# Patient Record
Sex: Male | Born: 1960 | Race: White | Hispanic: No | Marital: Married | Smoking: Never smoker
Health system: Southern US, Community
[De-identification: ages and names within clinical notes are randomized; demographics above are authoritative.]

## PROBLEM LIST (undated history)

## (undated) DIAGNOSIS — K219 Gastro-esophageal reflux disease without esophagitis: Secondary | ICD-10-CM

## (undated) DIAGNOSIS — M1A9XX0 Chronic gout, unspecified, without tophus (tophi): Secondary | ICD-10-CM

## (undated) DIAGNOSIS — Z8051 Family history of malignant neoplasm of kidney: Secondary | ICD-10-CM

## (undated) DIAGNOSIS — E785 Hyperlipidemia, unspecified: Secondary | ICD-10-CM

## (undated) DIAGNOSIS — M5431 Sciatica, right side: Secondary | ICD-10-CM

## (undated) DIAGNOSIS — M109 Gout, unspecified: Secondary | ICD-10-CM

## (undated) DIAGNOSIS — F411 Generalized anxiety disorder: Secondary | ICD-10-CM

## (undated) DIAGNOSIS — Z973 Presence of spectacles and contact lenses: Secondary | ICD-10-CM

## (undated) DIAGNOSIS — Z800 Family history of malignant neoplasm of digestive organs: Secondary | ICD-10-CM

## (undated) DIAGNOSIS — M5137 Other intervertebral disc degeneration, lumbosacral region: Secondary | ICD-10-CM

## (undated) DIAGNOSIS — F419 Anxiety disorder, unspecified: Secondary | ICD-10-CM

## (undated) DIAGNOSIS — R351 Nocturia: Secondary | ICD-10-CM

## (undated) DIAGNOSIS — N529 Male erectile dysfunction, unspecified: Secondary | ICD-10-CM

## (undated) DIAGNOSIS — I10 Essential (primary) hypertension: Secondary | ICD-10-CM

## (undated) DIAGNOSIS — Z808 Family history of malignant neoplasm of other organs or systems: Secondary | ICD-10-CM

## (undated) DIAGNOSIS — Z85828 Personal history of other malignant neoplasm of skin: Secondary | ICD-10-CM

## (undated) DIAGNOSIS — M51379 Other intervertebral disc degeneration, lumbosacral region without mention of lumbar back pain or lower extremity pain: Secondary | ICD-10-CM

## (undated) HISTORY — PX: HERNIA REPAIR: SHX51

## (undated) HISTORY — DX: Family history of malignant neoplasm of other organs or systems: Z80.8

## (undated) HISTORY — PX: COLONOSCOPY: SHX174

## (undated) HISTORY — DX: Family history of malignant neoplasm of kidney: Z80.51

## (undated) HISTORY — PX: PROSTATE BIOPSY: SHX241

## (undated) HISTORY — PX: EYE SURGERY: SHX253

## (undated) HISTORY — PX: VASECTOMY: SHX75

## (undated) HISTORY — PX: NOSE SURGERY: SHX723

## (undated) HISTORY — DX: Family history of malignant neoplasm of digestive organs: Z80.0

---

## 1999-10-07 HISTORY — PX: INGUINAL HERNIA REPAIR: SUR1180

## 2004-10-11 ENCOUNTER — Ambulatory Visit: Payer: Self-pay | Admitting: Internal Medicine

## 2006-03-06 ENCOUNTER — Ambulatory Visit: Payer: Self-pay | Admitting: Unknown Physician Specialty

## 2006-03-18 ENCOUNTER — Ambulatory Visit: Payer: Self-pay | Admitting: Internal Medicine

## 2007-10-15 ENCOUNTER — Ambulatory Visit: Payer: Self-pay | Admitting: Internal Medicine

## 2007-10-21 ENCOUNTER — Ambulatory Visit: Payer: Self-pay | Admitting: Internal Medicine

## 2007-12-31 ENCOUNTER — Ambulatory Visit: Payer: Self-pay | Admitting: Specialist

## 2008-01-05 ENCOUNTER — Ambulatory Visit: Payer: Self-pay | Admitting: Specialist

## 2009-03-24 IMAGING — RF DG CHEST FLUORO
1 series · 15 of 16 positions shown · non-contrast
Comparison: none

REASON FOR EXAM: Right diaphragm paralysis
COMMENTS:

[Series 1: run · 4 acquisitions, 15 frames shown]
[im 1/4]
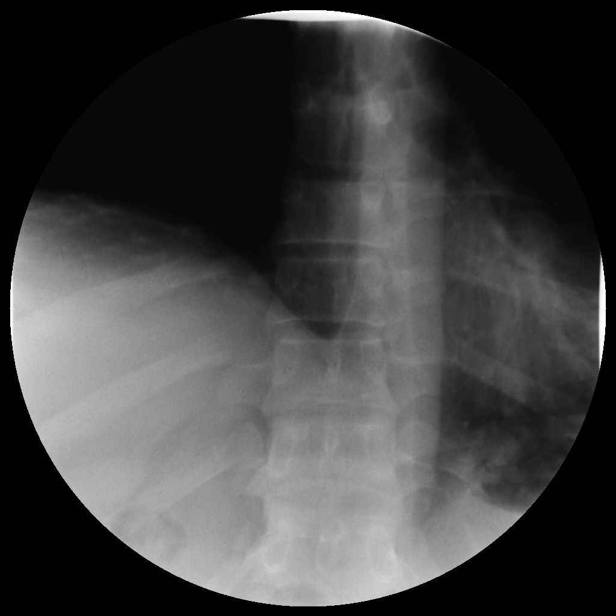
[im 1/4]
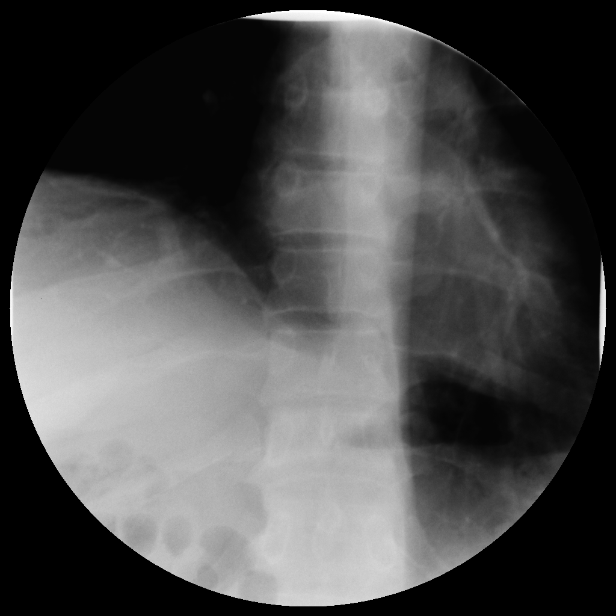
[im 1/4]
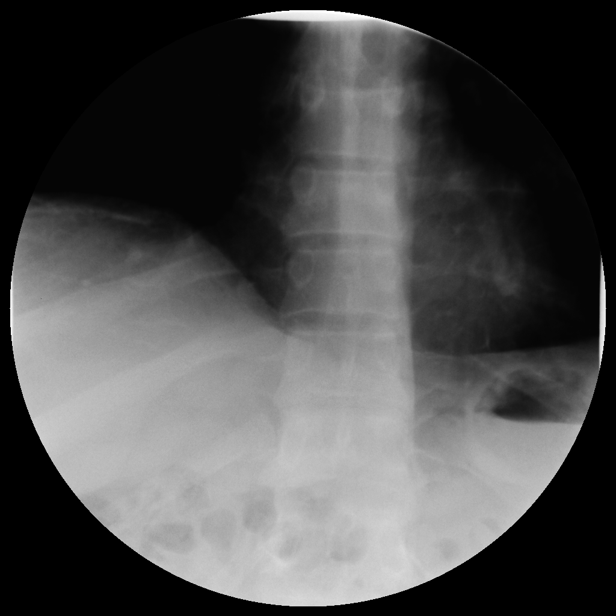
[im 1/4]
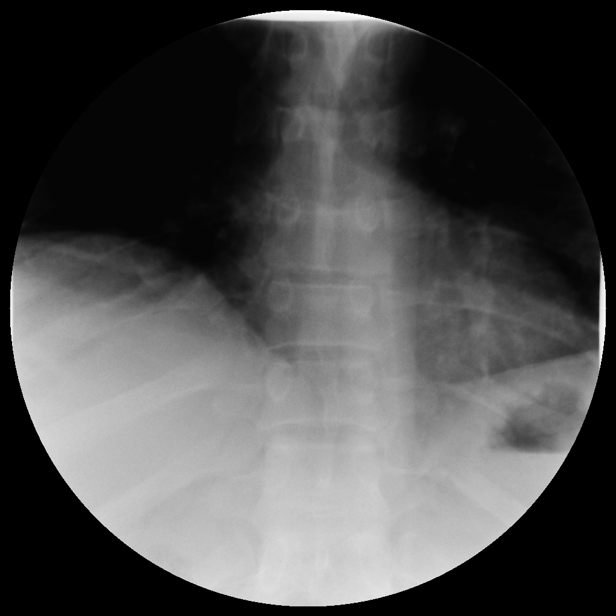
[im 2/4]
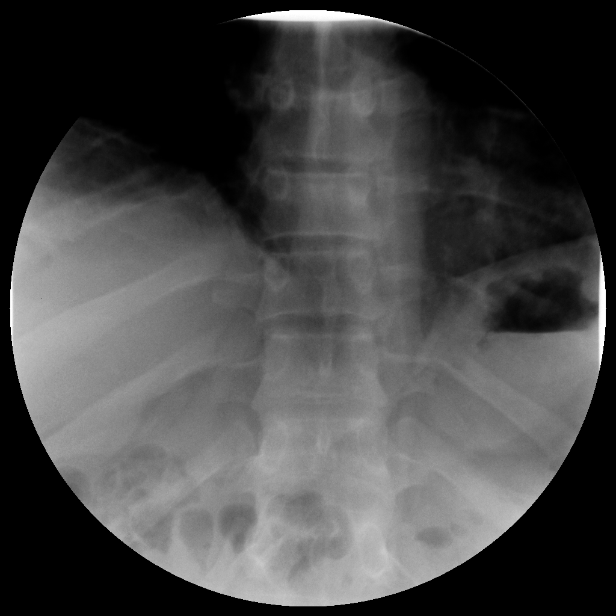
[im 2/4]
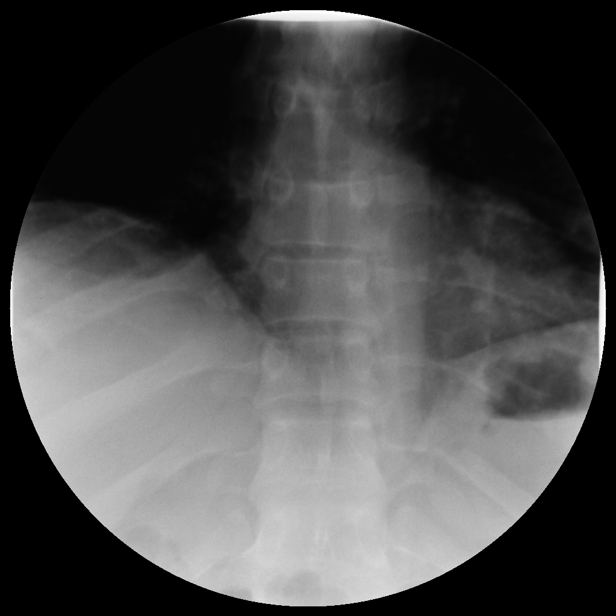
[im 2/4]
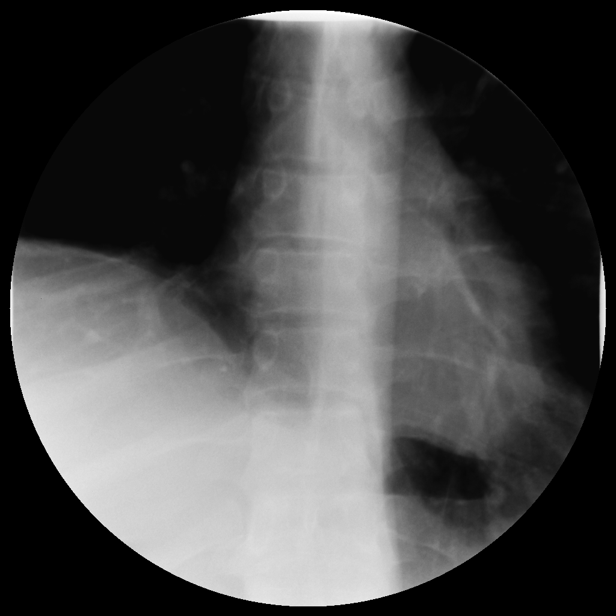
[im 3/4]
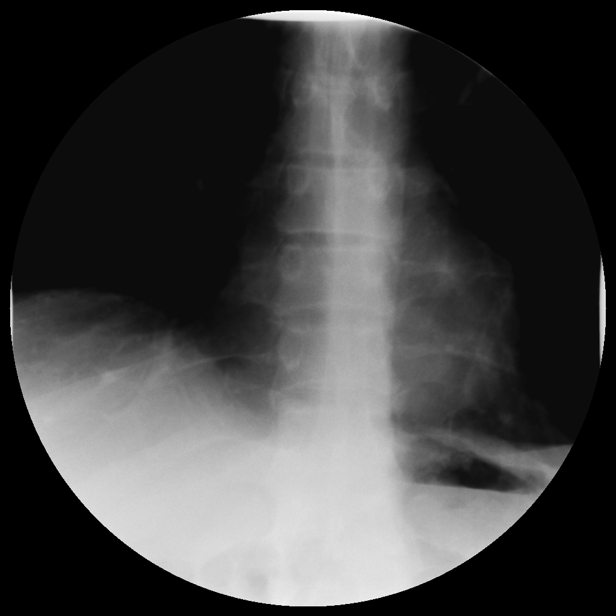
[im 3/4]
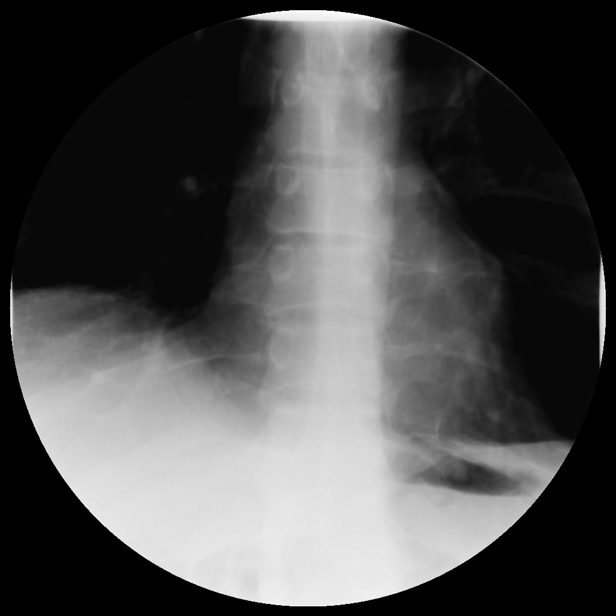
[im 3/4]
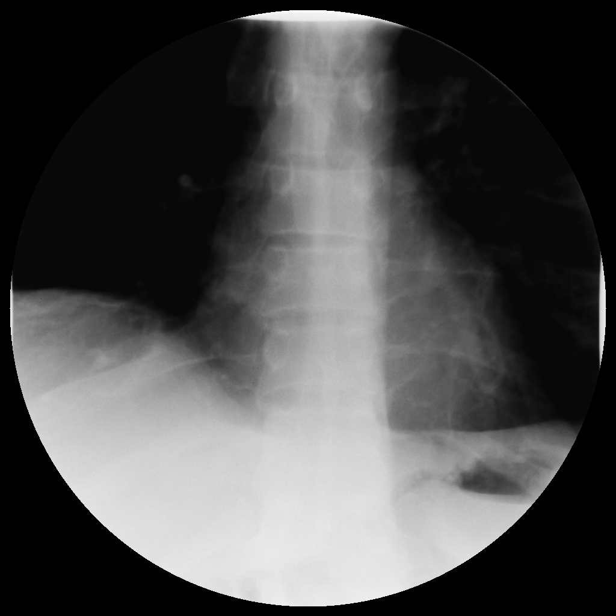
[im 3/4]
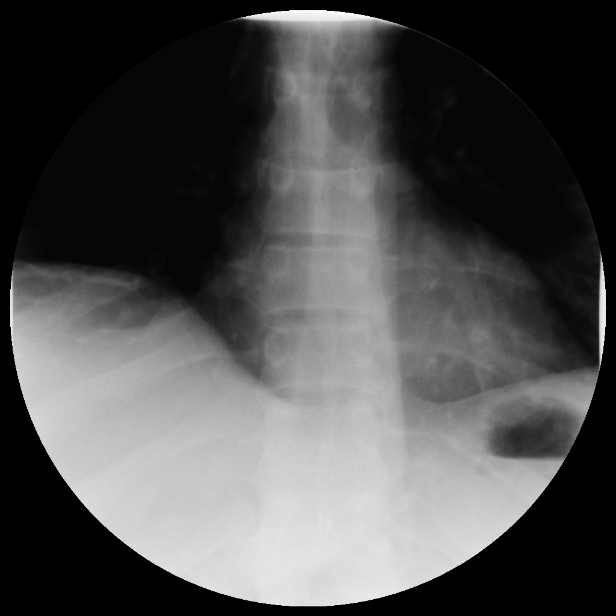
[im 4/4]
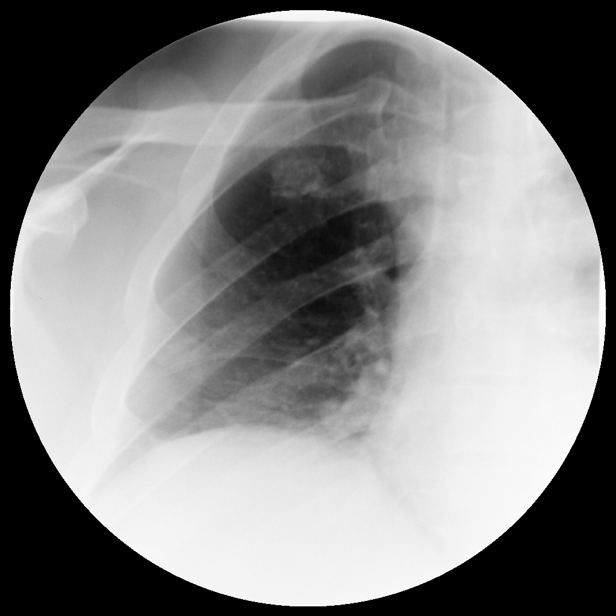
[im 4/4]
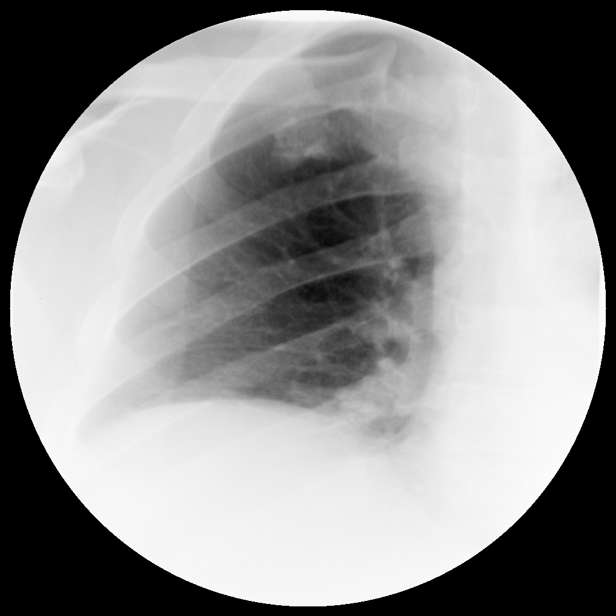
[im 4/4]
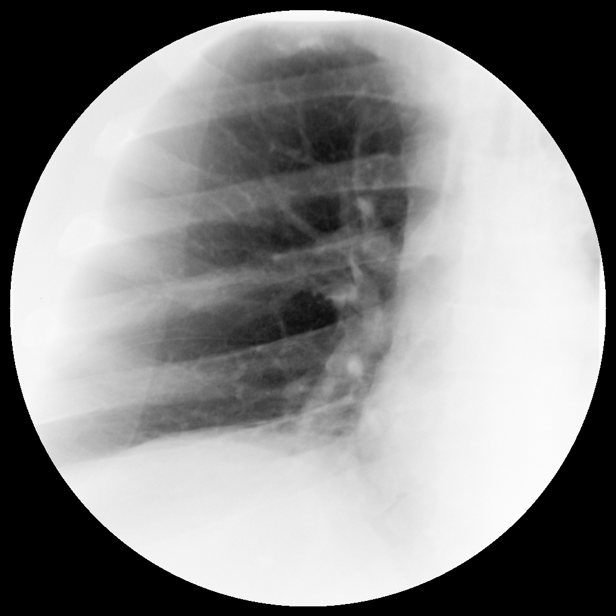
[im 4/4]
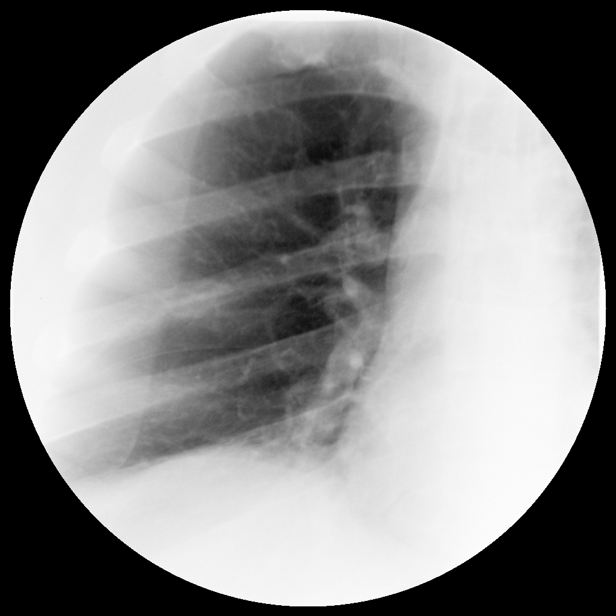

[15 of 16 positions shown; findings below may reference images not displayed]

PROCEDURE:     FL  - FL CHEST FLUORO  NO SPOT FILMS  - October 15, 2007 [DATE]

RESULT:     Fluoroscopic observation is performed during shallow and deep
breathing. There does appear to be movement of the LEFT and RIGHT sides of
the diaphragm. The LEFT shows a greater excursion inferiorly during deep
inspiration. The RIGHT chest appears to expand more laterally than
craniocaudad with deep inspiration. The patient is scheduled for a follow-up
CT reportedly.
IMPRESSION: Limited translation of the RIGHT side of the diaphragm when
compared to the LEFT. There is some motion present. Further investigation
with chest CT may be beneficial.

## 2012-10-06 HISTORY — PX: RETINAL DETACHMENT SURGERY: SHX105

## 2014-10-06 HISTORY — PX: BRONCHOSCOPY: SUR163

## 2014-11-03 DIAGNOSIS — M109 Gout, unspecified: Secondary | ICD-10-CM | POA: Insufficient documentation

## 2014-12-25 DIAGNOSIS — Z800 Family history of malignant neoplasm of digestive organs: Secondary | ICD-10-CM | POA: Insufficient documentation

## 2014-12-25 DIAGNOSIS — Z8042 Family history of malignant neoplasm of prostate: Secondary | ICD-10-CM | POA: Insufficient documentation

## 2016-02-26 DIAGNOSIS — I10 Essential (primary) hypertension: Secondary | ICD-10-CM | POA: Insufficient documentation

## 2017-08-13 ENCOUNTER — Ambulatory Visit: Payer: Self-pay | Admitting: Dietician

## 2017-09-02 ENCOUNTER — Encounter: Payer: BC Managed Care – PPO | Attending: Internal Medicine | Admitting: Dietician

## 2017-09-02 ENCOUNTER — Encounter: Payer: Self-pay | Admitting: Dietician

## 2017-09-02 VITALS — Ht 70.0 in | Wt 208.6 lb

## 2017-09-02 DIAGNOSIS — E663 Overweight: Secondary | ICD-10-CM

## 2017-09-02 DIAGNOSIS — R739 Hyperglycemia, unspecified: Secondary | ICD-10-CM | POA: Diagnosis not present

## 2017-09-02 DIAGNOSIS — E781 Pure hyperglyceridemia: Secondary | ICD-10-CM | POA: Diagnosis not present

## 2017-09-02 NOTE — Patient Instructions (Signed)
   Plan ahead for healthy nutritional balance in meals.   Include a "free" vegetable and/or fruit with each meal.   Choose mostly lowfat meats and dairy, limit fried foods. Choose healthy fats such as nuts, seeds, vegetable oils for cooking and seasoning foods.   Chose some whole grain foods and high fiber starches such as beans, peas, potatoes with skins, brown rice.

## 2017-09-02 NOTE — Progress Notes (Signed)
Medical Nutrition Therapy: Visit start time: 1630  end time: 1730  Assessment:  Diagnosis: hypertension, hyperglycemia Past medical history: gout Psychosocial issues/ stress concerns: none Preferred learning method:  . Auditory . Visual . Hands-on  Current weight: 208.4lbs with shoes  Height: 5'10" Medications, supplements: reconciled list in medical record  Progress and evaluation: Patient reports weight loss of 5-6lbs in recent weeks, in effort to reduce blood pressure and blood lipids. Patient has worked primarly on portion control. He reports frequent dining out due to late work hours for his wife.   Physical activity: walking 1-2 times a week, higher ADLs, yard work recently. Considering adding yoga  Dietary Intake:  Usual eating pattern includes 3 meals and 1-2 snacks per day. Dining out frequency: 4-5 meals per week.  Breakfast: coffee, egg sandwich, or premier or other protein shake; or yogurt; banana Snack: granola or protein bar, coffee black Lunch: often leftovers at home-- recently macaroni, stuffing, potato; 1-2 slices pizza; sub sandwich 1xmonth with chips and cookie; brunswick stew; if fast food--arbys light beef, potato cake; chicken sandwich or burger  Snack: none Supper: Italian--lasagna 1/2 portion with salad; pizza; mexican--tacos or burrito with beans and rice; chicken wings; at home grilled chicken/ meat; taco salad; crock pot meal roast with potatoes Snack: occasional popcorn, rarely ice cream; popsicle Beverages: water, 1-2 beers with dinner; rarely sweet tea or soda  Nutrition Care Education: Topics covered: weight control, triglyceride-lowering Basic nutrition: basic food groups, appropriate nutrient balance, appropriate meal and snack schedule, general nutrition guidelines    Weight control: benefits of weight control, identifying healthy weight, determining reasonable weight goal; provided guidance for 1800kcal daily intake.  Hypertriglyceridemia:  target  goals for lipids, healthy and unhealthy fats, role of fiber, food sources of potassium, magnesium (for BP control), plant sterols  Nutritional Diagnosis:  [place]-2.2 Altered nutrition-related laboratory As related to hypertriglyceridemia, history of hyperglycemia, hypertension.  As evidenced by lab report, MD notes. [place]-3.3 Overweight/obesity As related to excess calories, inactivity.  As evidenced by BMI 29, patient report.  Intervention: Instruction as noted above.   Set goals, listed notes with input from patient.   Patient has been making healthy diet changes, has noticed some benefit, and is motivated to continue.   No follow-up scheduled at this time, patient will schedule later if needed.  Education Materials given:  . Bad fats, Healthy fats chart (AHA) . Food Guide for [employer] . Food lists/ Planning A Balanced Meal . Goals/ instructions   Learner/ who was taught:  . Patient    Level of understanding: Marland Kitchen Verbalizes/ demonstrates competency  Demonstrated degree of understanding via:   Teach back Learning barriers: . None  Willingness to learn/ readiness for change: . Eager, change in progress   Monitoring and Evaluation:  Dietary intake, exercise, BG, BP, lipid control, and body weight      follow up: prn

## 2017-10-06 HISTORY — PX: COLONOSCOPY: SHX174

## 2018-01-20 DIAGNOSIS — E782 Mixed hyperlipidemia: Secondary | ICD-10-CM | POA: Insufficient documentation

## 2020-06-07 ENCOUNTER — Ambulatory Visit
Admission: RE | Admit: 2020-06-07 | Discharge: 2020-06-07 | Disposition: A | Discharge status: Home / Self Care | Payer: BC Managed Care – PPO | Source: Ambulatory Visit | Attending: Internal Medicine | Admitting: Internal Medicine

## 2020-06-07 ENCOUNTER — Other Ambulatory Visit: Payer: Self-pay | Admitting: Internal Medicine

## 2020-06-07 ENCOUNTER — Other Ambulatory Visit: Payer: Self-pay

## 2020-06-07 DIAGNOSIS — R519 Headache, unspecified: Secondary | ICD-10-CM | POA: Insufficient documentation

## 2020-07-05 DIAGNOSIS — Z8616 Personal history of COVID-19: Secondary | ICD-10-CM | POA: Insufficient documentation

## 2021-10-06 DIAGNOSIS — Z85828 Personal history of other malignant neoplasm of skin: Secondary | ICD-10-CM

## 2021-10-06 HISTORY — DX: Personal history of other malignant neoplasm of skin: Z85.828

## 2021-10-06 HISTORY — PX: MOHS SURGERY: SUR867

## 2021-11-15 IMAGING — CT CT HEAD W/O CM
3 series · 16 of 47 positions shown, 19 images · non-contrast
Comparison: MRI 4444

CLINICAL DATA: Headache

EXAM:
CT HEAD WITHOUT CONTRAST
TECHNIQUE: Contiguous axial images were obtained from the base of the skull
through the vertex without intravenous contrast.

[Series 2: head wo · axial · 0.47mm/px · z∈[-141,-11]mm · 10 of 32 slices shown, 13 images]
[im 3/32  brain]
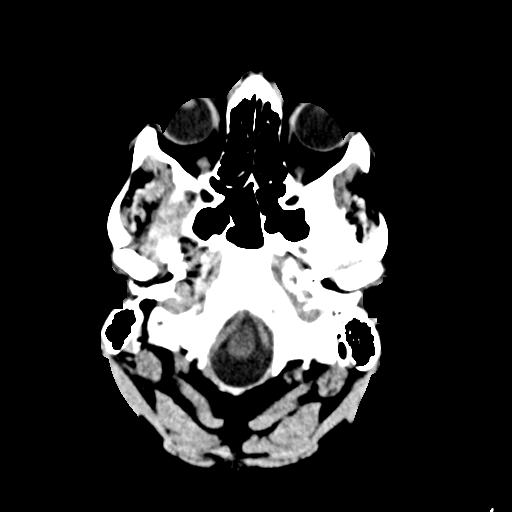
[im 3/32  bone]
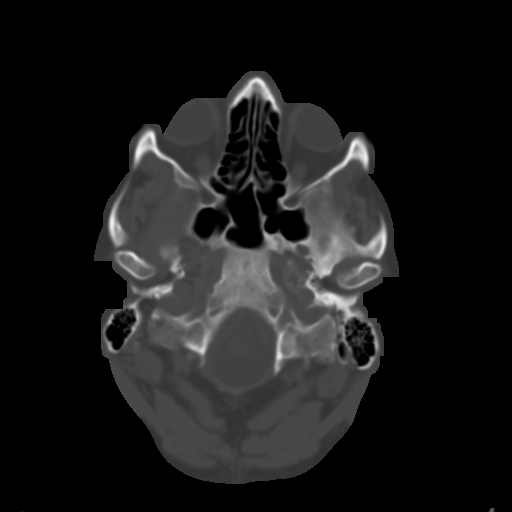
[im 6/32  brain]
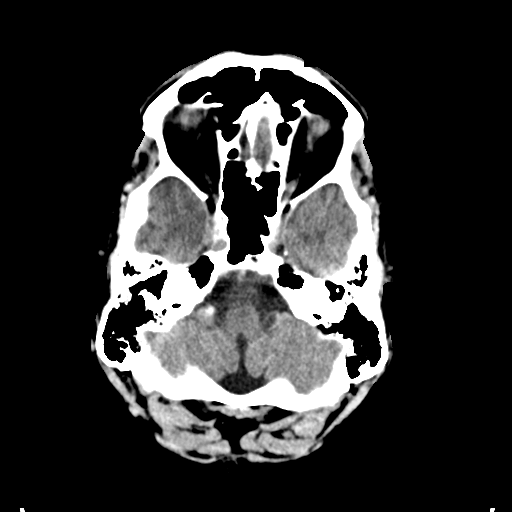
[im 9/32  brain]
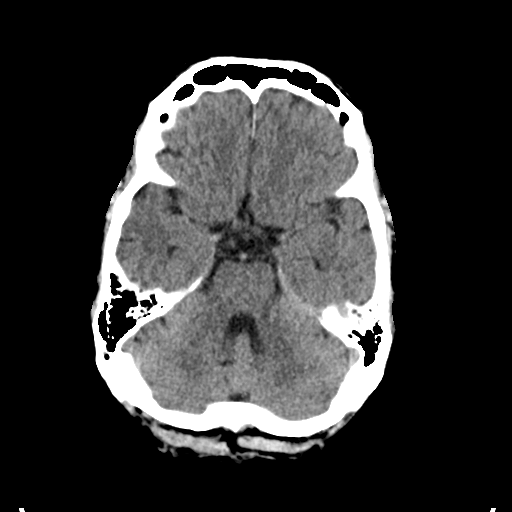
[im 11/32  brain]
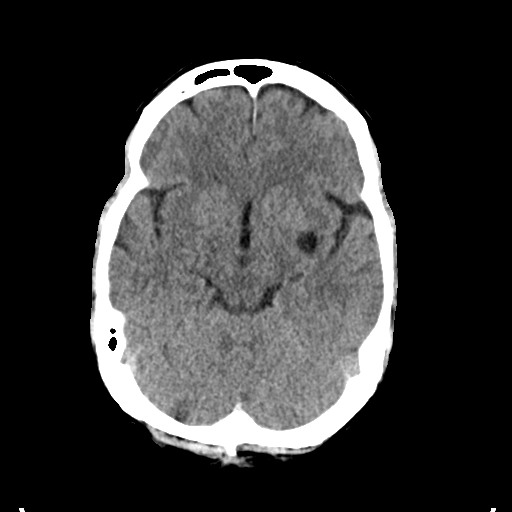
[im 14/32  brain]
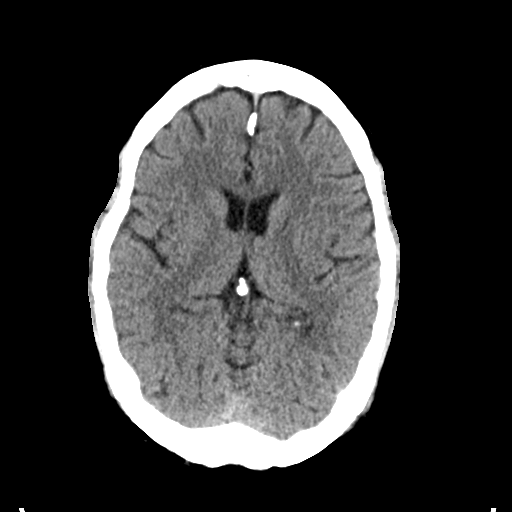
[im 14/32  bone]
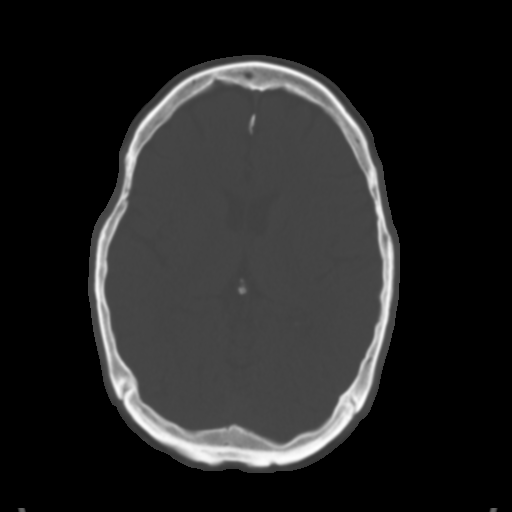
[im 18/32  brain]
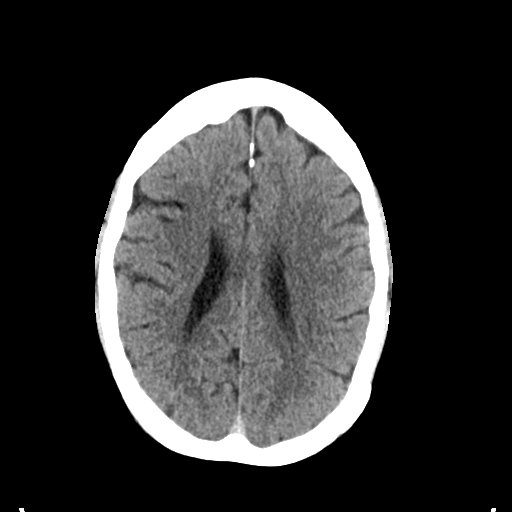
[im 21/32  brain]
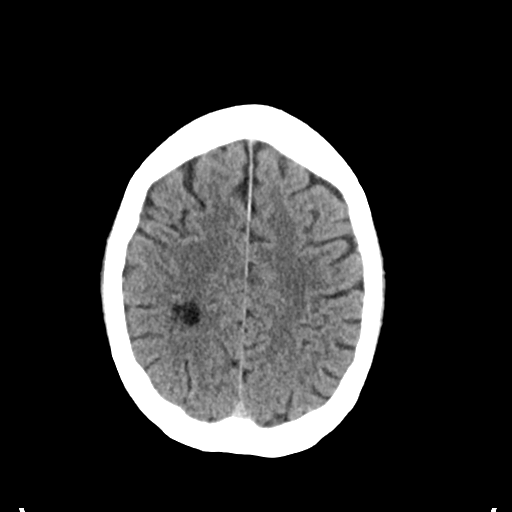
[im 24/32  brain]
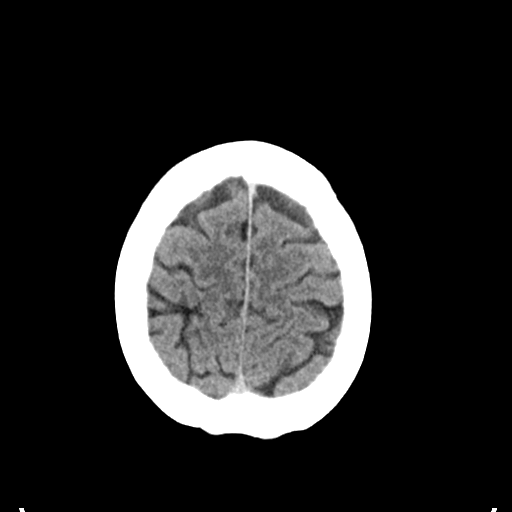
[im 26/32  brain]
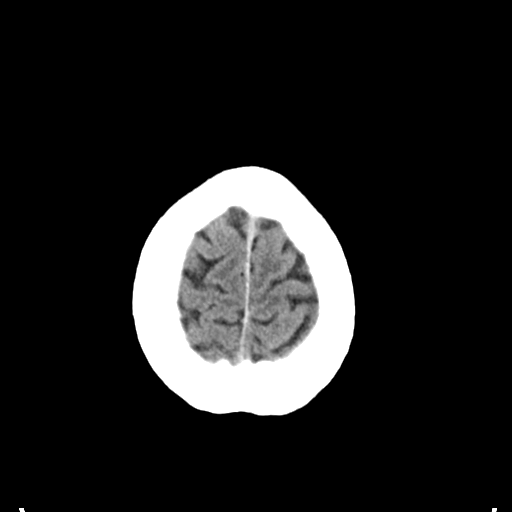
[im 26/32  bone]
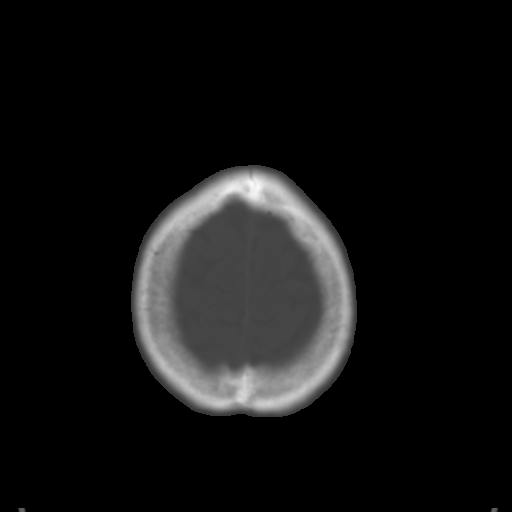
[im 29/32  brain]
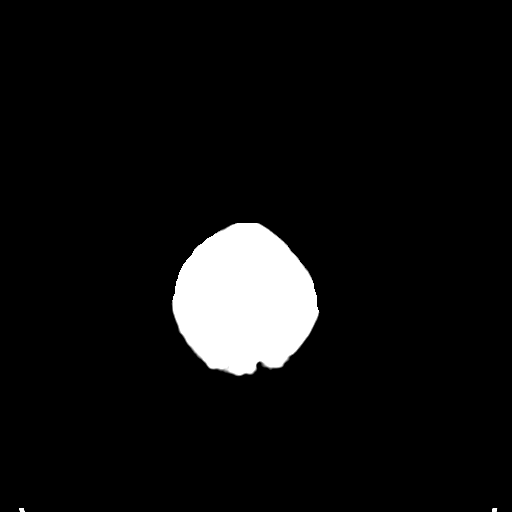

[Series 4: coronal soft tissue · coronal · 0.31mm/px · 3 of 67 slices shown]
[im 23/67  brain]
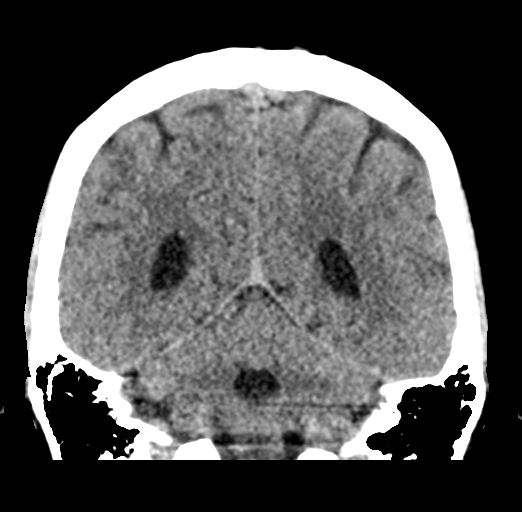
[im 30/67  brain]
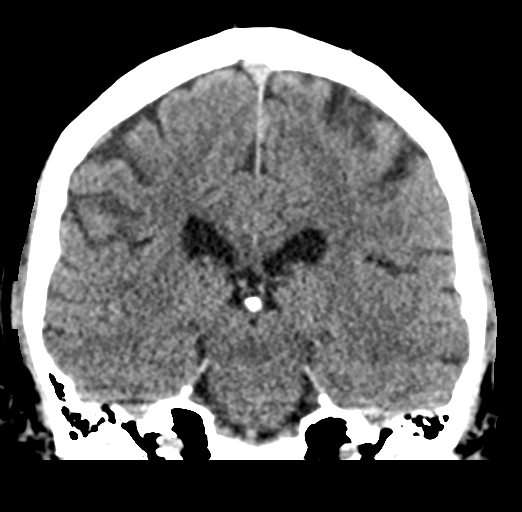
[im 37/67  brain]
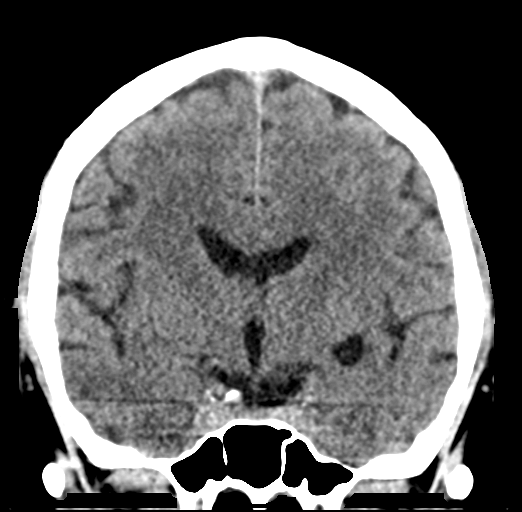

[Series 5: sagittal soft tissue · sagittal · 0.31mm/px · 3 of 54 slices shown]
[im 18/54  brain]
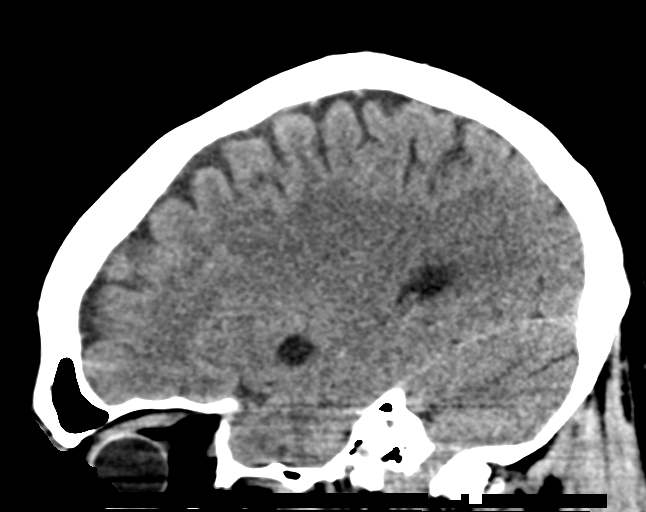
[im 27/54  brain]
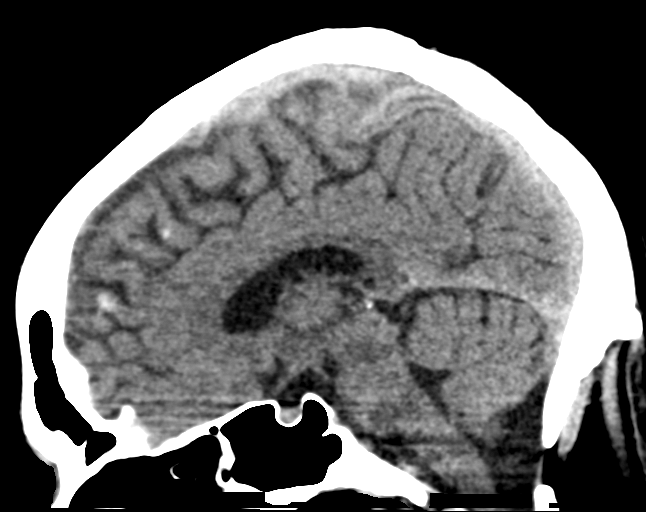
[im 36/54  brain]
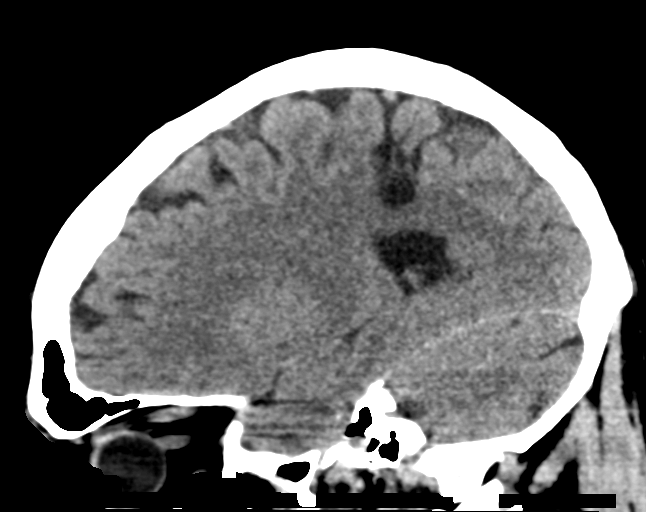

[16 of 47 positions shown; findings below may reference images not displayed]

FINDINGS: Brain: There is no acute intracranial hemorrhage, mass effect, or
edema.

No acute appearing loss of gray-white differentiation. There is a
small area encephalomalacia in the right parietal lobe. An even
smaller area is present in the right frontal lobe. Low-density
lesion of the inferior left basal ganglia probably reflects a
prominent perivascular space.

There is no extra-axial fluid collection. Ventricles and sulci are
within normal limits in size and configuration.

Vascular: No hyperdense vessel or unexpected calcification.

Skull: Calvarium is unremarkable.

Sinuses/Orbits: No acute finding.

Other: None.
IMPRESSION: No acute intracranial abnormality. Small areas of right frontal and
parietal encephalomalacia, which were present in 4444.

## 2022-07-23 ENCOUNTER — Other Ambulatory Visit: Payer: Self-pay | Admitting: Internal Medicine

## 2022-07-23 DIAGNOSIS — M5416 Radiculopathy, lumbar region: Secondary | ICD-10-CM

## 2022-08-18 ENCOUNTER — Ambulatory Visit
Admission: RE | Admit: 2022-08-18 | Discharge: 2022-08-18 | Disposition: A | Discharge status: Home / Self Care | Payer: BC Managed Care – PPO | Source: Ambulatory Visit | Attending: Internal Medicine | Admitting: Internal Medicine

## 2022-08-18 DIAGNOSIS — M5416 Radiculopathy, lumbar region: Secondary | ICD-10-CM

## 2022-09-05 DIAGNOSIS — Z87820 Personal history of traumatic brain injury: Secondary | ICD-10-CM

## 2022-09-05 HISTORY — DX: Personal history of traumatic brain injury: Z87.820

## 2022-09-16 ENCOUNTER — Other Ambulatory Visit: Payer: Self-pay | Admitting: Internal Medicine

## 2022-09-16 ENCOUNTER — Ambulatory Visit
Admission: RE | Admit: 2022-09-16 | Discharge: 2022-09-16 | Disposition: A | Discharge status: Home / Self Care | Payer: BC Managed Care – PPO | Source: Ambulatory Visit | Attending: Internal Medicine | Admitting: Internal Medicine

## 2022-09-16 DIAGNOSIS — R42 Dizziness and giddiness: Secondary | ICD-10-CM | POA: Insufficient documentation

## 2022-09-16 DIAGNOSIS — H539 Unspecified visual disturbance: Secondary | ICD-10-CM | POA: Diagnosis not present

## 2022-09-16 DIAGNOSIS — F0781 Postconcussional syndrome: Secondary | ICD-10-CM | POA: Diagnosis not present

## 2022-09-24 ENCOUNTER — Other Ambulatory Visit: Payer: Self-pay | Admitting: Internal Medicine

## 2022-09-24 DIAGNOSIS — R972 Elevated prostate specific antigen [PSA]: Secondary | ICD-10-CM

## 2022-09-24 DIAGNOSIS — Z8042 Family history of malignant neoplasm of prostate: Secondary | ICD-10-CM

## 2022-10-04 ENCOUNTER — Ambulatory Visit
Admission: RE | Admit: 2022-10-04 | Discharge: 2022-10-04 | Disposition: A | Discharge status: Home / Self Care | Payer: BC Managed Care – PPO | Source: Ambulatory Visit | Attending: Internal Medicine | Admitting: Internal Medicine

## 2022-10-04 DIAGNOSIS — Z8042 Family history of malignant neoplasm of prostate: Secondary | ICD-10-CM | POA: Insufficient documentation

## 2022-10-04 DIAGNOSIS — R972 Elevated prostate specific antigen [PSA]: Secondary | ICD-10-CM | POA: Insufficient documentation

## 2022-10-04 MED ORDER — GADOBUTROL 1 MMOL/ML IV SOLN
9.0000 mL | Freq: Once | INTRAVENOUS | Status: AC | PRN
  Administered 2022-10-04: 10 mL via INTRAVENOUS

## 2022-10-10 ENCOUNTER — Encounter: Payer: Self-pay | Admitting: Internal Medicine

## 2022-10-10 ENCOUNTER — Other Ambulatory Visit: Payer: Self-pay | Admitting: Internal Medicine

## 2022-10-10 DIAGNOSIS — C61 Malignant neoplasm of prostate: Secondary | ICD-10-CM

## 2022-10-21 ENCOUNTER — Ambulatory Visit: Payer: BC Managed Care – PPO

## 2022-11-06 DIAGNOSIS — C61 Malignant neoplasm of prostate: Secondary | ICD-10-CM

## 2022-11-06 DIAGNOSIS — R972 Elevated prostate specific antigen [PSA]: Secondary | ICD-10-CM

## 2022-11-06 HISTORY — DX: Malignant neoplasm of prostate: C61

## 2022-11-06 HISTORY — DX: Elevated prostate specific antigen (PSA): R97.20

## 2022-11-28 ENCOUNTER — Telehealth: Payer: Self-pay | Admitting: Radiation Oncology

## 2022-11-28 ENCOUNTER — Other Ambulatory Visit (HOSPITAL_COMMUNITY): Payer: Self-pay | Admitting: Urology

## 2022-11-28 DIAGNOSIS — C61 Malignant neoplasm of prostate: Secondary | ICD-10-CM

## 2022-11-28 DIAGNOSIS — C7951 Secondary malignant neoplasm of bone: Secondary | ICD-10-CM

## 2022-11-28 NOTE — Telephone Encounter (Signed)
Unable to leave message for patient to call back to schedule consult per 2/23 referral. Will try again Monday.

## 2022-12-04 ENCOUNTER — Encounter: Payer: Self-pay | Admitting: Radiation Oncology

## 2022-12-04 NOTE — Progress Notes (Signed)
GU Location of Tumor / Histology: Prostate Ca  If Prostate Cancer, Gleason Score is (4 + 3) and PSA is (2.45)  Biopsies      10/04/2022 Dr. Emily Filbert MR Prostate with/without Contrast CLINICAL DATA:  A 62 year old male presents with elevated PSA with increased PSA velocity and abnormal digital rectal examination.  FINDINGS: Prostate: Transitional zone: Signs of BPH. Mild changes of BPH. No high-risk lesion in the transitional zone.   Peripheral zone: Abnormal low T2 signal throughout the RIGHT posterolateral peripheral zone extending from the apex to the base with markedly restricted diffusion extending to the anterior gland near the prostate apex on the RIGHT. Lesion measuring greater than 15 mm in the axial plane approximately 18 mm single greatest axial dimension on ADC (image 15/8) lesion displays marked restricted diffusion on calculated high B value images. Indistinct capsular margins along the RIGHT posterolateral gland. PIRADS category 5.   Volume: 19.9 cc   Transcapsular spread: Suspected along RIGHT posterolateral gland from the base through the mid gland. Also long segment capsular abutment of this large lesion.   Seminal vesicle involvement: Absent   Neurovascular bundle involvement: Suspected on the RIGHT (image 35/10)   Pelvic adenopathy: Absent   Bone metastasis: Sclerotic lesion in the LEFT iliac bone is nonspecific showing no signs of enhancement.   Other findings: Extensive diverticular disease of the colon.   IMPRESSION: 1. PIRADS category 5 lesion in the RIGHT posterolateral peripheral zone extending from the apex to the base with markedly restricted diffusion extending to the anterior gland near the prostate apex on the RIGHT. 2. Indistinct capsular margins along the RIGHT posterolateral gland from the base through the mid gland suspicious for extracapsular extension. 3. Neurovascular bundle involvement on the RIGHT at the base. 4. Sclerotic lesion in  the LEFT iliac bone is nonspecific showing no signs of enhancement. This may represent a bone island. Urologic consultation is advised if not yet performed for further management of above findings. PSA level and correlation with tissue diagnosis as warranted may be helpful to guide further management. 5. Extensive diverticular disease of the colon.   Past/Anticipated interventions by urology, if any:   Past/Anticipated interventions by medical oncology, if any: NA  Weight changes, if any: {:18581}  IPSS: SHIM:  Bowel/Bladder complaints, if any: {:18581}   Nausea/Vomiting, if any: {:18581}  Pain issues, if any:  {:18581}  SAFETY ISSUES: Prior radiation? {:18581} Pacemaker/ICD? {:18581} Possible current pregnancy? Male Is the patient on methotrexate? No  Current Complaints / other details:

## 2022-12-15 ENCOUNTER — Ambulatory Visit (HOSPITAL_COMMUNITY)
Admission: RE | Admit: 2022-12-15 | Discharge: 2022-12-15 | Disposition: A | Discharge status: Home / Self Care | Payer: BC Managed Care – PPO | Source: Ambulatory Visit | Attending: Urology | Admitting: Urology

## 2022-12-15 DIAGNOSIS — C7951 Secondary malignant neoplasm of bone: Secondary | ICD-10-CM | POA: Insufficient documentation

## 2022-12-15 DIAGNOSIS — C61 Malignant neoplasm of prostate: Secondary | ICD-10-CM | POA: Insufficient documentation

## 2022-12-15 MED ORDER — PIFLIFOLASTAT F 18 (PYLARIFY) INJECTION
9.0000 | Freq: Once | INTRAVENOUS | Status: AC
  Administered 2022-12-15: 9.95 via INTRAVENOUS

## 2022-12-17 ENCOUNTER — Ambulatory Visit
Admission: RE | Admit: 2022-12-17 | Discharge: 2022-12-17 | Disposition: A | Discharge status: Home / Self Care | Payer: BC Managed Care – PPO | Source: Ambulatory Visit | Attending: Radiation Oncology | Admitting: Radiation Oncology

## 2022-12-17 ENCOUNTER — Encounter: Payer: Self-pay | Admitting: Radiation Oncology

## 2022-12-17 VITALS — BP 139/90 | HR 84 | Temp 96.9°F | Resp 18 | Ht 70.0 in | Wt 213.1 lb

## 2022-12-17 DIAGNOSIS — Z800 Family history of malignant neoplasm of digestive organs: Secondary | ICD-10-CM | POA: Diagnosis not present

## 2022-12-17 DIAGNOSIS — K219 Gastro-esophageal reflux disease without esophagitis: Secondary | ICD-10-CM | POA: Insufficient documentation

## 2022-12-17 DIAGNOSIS — F17210 Nicotine dependence, cigarettes, uncomplicated: Secondary | ICD-10-CM | POA: Insufficient documentation

## 2022-12-17 DIAGNOSIS — M109 Gout, unspecified: Secondary | ICD-10-CM | POA: Diagnosis not present

## 2022-12-17 DIAGNOSIS — C61 Malignant neoplasm of prostate: Secondary | ICD-10-CM | POA: Insufficient documentation

## 2022-12-17 DIAGNOSIS — Z85828 Personal history of other malignant neoplasm of skin: Secondary | ICD-10-CM | POA: Diagnosis not present

## 2022-12-17 DIAGNOSIS — Z791 Long term (current) use of non-steroidal anti-inflammatories (NSAID): Secondary | ICD-10-CM | POA: Diagnosis not present

## 2022-12-17 DIAGNOSIS — I10 Essential (primary) hypertension: Secondary | ICD-10-CM | POA: Diagnosis not present

## 2022-12-17 DIAGNOSIS — Z79899 Other long term (current) drug therapy: Secondary | ICD-10-CM | POA: Diagnosis not present

## 2022-12-17 DIAGNOSIS — E785 Hyperlipidemia, unspecified: Secondary | ICD-10-CM | POA: Diagnosis not present

## 2022-12-17 HISTORY — DX: Gastro-esophageal reflux disease without esophagitis: K21.9

## 2022-12-17 HISTORY — DX: Male erectile dysfunction, unspecified: N52.9

## 2022-12-17 HISTORY — DX: Personal history of other malignant neoplasm of skin: Z85.828

## 2022-12-17 HISTORY — DX: Essential (primary) hypertension: I10

## 2022-12-17 HISTORY — DX: Anxiety disorder, unspecified: F41.9

## 2022-12-17 HISTORY — DX: Gout, unspecified: M10.9

## 2022-12-17 HISTORY — DX: Hyperlipidemia, unspecified: E78.5

## 2022-12-17 NOTE — Progress Notes (Addendum)
Radiation Oncology         [phone] ________________________________  Initial Outpatient Consultation  Name: Marcus Kramer MRN: [id number]  Date: 12/17/2022  DOB: Jun 14, 1961  WU:[id number], Marcus Negus, MD  Marcus Purpura, MD   REFERRING PHYSICIAN: Heloise Purpura, MD  DIAGNOSIS: 62 y.o. 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    ICD-10-CM   1. Malignant neoplasm of prostate [facility])  C61 Ambulatory Referral to Genetics      HISTORY OF PRESENT ILLNESS: Marcus Kramer is a 62 y.o. male with a diagnosis of prostate cancer. Marcus Kramer was noted to have a rising PSA of 2.45, on routine labs with his primary care physician, Marcus Kramer.  He has a strong paternal family history of prostate cancer and has been undergoing PSA screening every 6 months with a baseline PSA between 1.6 -2.0.  The elevation in PSA prompted further evaluation with a prostate MRI which was performed on 10/07/2022 and showed an 18 mm PI-RADS 5 lesion on the right, from apex to base, with possible extracapsular extension and neurovascular bundle involvement.  There was no concerning lymphadenopathy but they did comment on an indeterminate sclerotic lesion in the left iliac bone.  Accordingly, he was referred for evaluation in urology by Dr. Laverle Kramer on 10/28/2022,  digital rectal examination was performed at that time revealing very slight induration on the right, not particularly suspicious.  The patient proceeded to MRI fusion transrectal ultrasound with 16 biopsies of the prostate on 11/06/2022.  The prostate volume measured 24 cc.  Out of 16 core biopsies, 10 were positive.  The maximum Gleason score was 4+3, and this was seen in all 4/4 samples from the MRI ROI and in the right mid lateral core.  Additionally, Gleason 3+4 was seen in the right mid, right apex and right apex lateral and Gleason 3+3 in the left mid and right base lateral.  He had a  PSMA PET scan for disease staging on 12/15/2022 and this was negative for any evidence of osseous or visceral metastases.  Marcus Kramer reviewed the biopsy and imaging results with his urologist and he has kindly been referred today for discussion of potential radiation treatment options.   PREVIOUS RADIATION THERAPY: No  PAST MEDICAL HISTORY:  Past Medical History:  Diagnosis Date   Anxiety    ED (erectile dysfunction)    ED (erectile dysfunction)    Elevated PSA 11/06/2022   10/28/2022   GERD (gastroesophageal reflux disease)    Gout    Gout    History of skin cancer    Hyperlipidemia    Hypertension       PAST SURGICAL HISTORY: Past Surgical History:  Procedure Laterality Date   COLONOSCOPY     EYE SURGERY Right    HERNIA REPAIR     NOSE SURGERY     PROSTATE BIOPSY     PROSTATE BIOPSY     VASECTOMY      FAMILY HISTORY:  Family History  Problem Relation Age of Onset   Brain cancer Mother    Colon cancer Mother    Stroke Father    Parkinson's disease Father    Brain cancer Maternal Grandfather    Heart attack Maternal Grandfather    Lung cancer Maternal Grandfather    Parkinson's disease Paternal Grandfather    Stroke Paternal Grandfather     SOCIAL HISTORY:  Social History   Socioeconomic History  Marital status: Married    Spouse name: Not on file   Number of children: Not on file   Years of education: Not on file   Highest education level: Not on file  Occupational History   Not on file  Tobacco Use   Smoking status: Every Day    Types: Cigars   Smokeless tobacco: Never   Tobacco comments:    occasional, none in past year  Vaping Use   Vaping Use: Never used  Substance and Sexual Activity   Alcohol use: Yes    Alcohol/week: 9.0 standard drinks of alcohol    Types: 9 Standard drinks or equivalent per week    Comment: 10 or less drinks per week   Drug use: Never   Sexual activity: Yes  Other Topics Concern   Not on file  Social History  Narrative   Not on file   Social Determinants of Health   Financial Resource Strain: Not on file  Food Insecurity: No Food Insecurity (12/17/2022)   Hunger Vital Sign    Worried About Running Out of Food in the Last Year: Never true    Ran Out of Food in the Last Year: Never true  Transportation Needs: No Transportation Needs (12/17/2022)   PRAPARE - Administrator, Civil Service (Medical): No    Lack of Transportation (Non-Medical): No  Physical Activity: Not on file  Stress: Not on file  Social Connections: Not on file  Intimate Partner Violence: Not At Risk (12/17/2022)   Humiliation, Afraid, Rape, and Kick questionnaire    Fear of Current or Ex-Partner: No    Emotionally Abused: No    Physically Abused: No    Sexually Abused: No    ALLERGIES: Sulfa antibiotics  MEDICATIONS:  Current Outpatient Medications  Medication Sig Dispense Refill   diclofenac (VOLTAREN) 75 MG EC tablet Take 75 mg by mouth 2 (two) times daily.     losartan (COZAAR) 100 MG tablet Take 100 mg by mouth daily.     venlafaxine XR (EFFEXOR-XR) 75 MG 24 hr capsule Take 75 mg by mouth daily.     allopurinol (ZYLOPRIM) 300 MG tablet TAKE ONE TABLET EVERY DAY     DIPHENHYDRAMINE-ACETAMINOPHEN PO Take by mouth.     Multiple Vitamin (MULTI-VITAMINS) TABS Take by mouth.     omeprazole (PRILOSEC) 40 MG capsule TAKE 1 CAPSULE BY MOUTH EVERY MORNING FOR ACID     sildenafil (REVATIO) 20 MG tablet TAKE ONE TABLET 3 TIMES DAILY AS NEEDED     No current facility-administered medications for this encounter.    REVIEW OF SYSTEMS:  On review of systems, Marcus Kramer reports that he is doing well Kramer. He denies any chest pain, shortness of breath, cough, fevers, chills, night sweats, unintended weight changes. He denies any bowel disturbances, and denies abdominal pain, nausea or vomiting. He denies any new musculoskeletal or joint aches or pains. His IPSS was 7, indicating mild urinary symptoms. His SHIM was  24, indicating he does not have erectile dysfunction. A complete review of systems is obtained and is otherwise negative.    PHYSICAL EXAM:  Wt Readings from Last 3 Encounters:  12/17/22 213 lb 2 oz (96.7 kg)  09/02/17 208 lb 9.6 oz (94.6 kg)   Temp Readings from Last 3 Encounters:  12/17/22 (!) 96.9 F (36.1 C) (Temporal)   BP Readings from Last 3 Encounters:  12/17/22 (!) 139/90   Pulse Readings from Last 3 Encounters:  12/17/22 84   Pain  Assessment Pain Score: 0-No pain/10  In general this is a well appearing Caucasian man in no acute distress. He's alert and oriented x4 and appropriate throughout the examination. Cardiopulmonary assessment is negative for acute distress, and he exhibits normal effort.     KPS = 90  100 - Normal; no complaints; no evidence of disease. 90   - Able to carry on normal activity; minor signs or symptoms of disease. 80   - Normal activity with effort; some signs or symptoms of disease. 73   - Cares for self; unable to carry on normal activity or to do active work. 60   - Requires occasional assistance, but is able to care for most of his personal needs. 50   - Requires considerable assistance and frequent medical care. 40   - Disabled; requires special care and assistance. 30   - Severely disabled; hospital admission is indicated although death not imminent. 20   - Very sick; hospital admission necessary; active supportive treatment necessary. 10   - Moribund; fatal processes progressing rapidly. 0     - Dead  Karnofsky DA, Abelmann WH, Craver LS and Burchenal JH [phone]) The use of the nitrogen mustards in the palliative treatment of carcinoma: with particular reference to bronchogenic carcinoma Cancer 1 634-56  LABORATORY DATA:  No results found for: "WBC", "HGB", "HCT", "MCV", "PLT" No results found for: "NA", "K", "CL", "CO2" No results found for: "ALT", "AST", "GGT", "ALKPHOS", "BILITOT"   RADIOGRAPHY: NM PET (PSMA) SKULL TO MID  THIGH  Result Date: 12/16/2022 CLINICAL DATA:  History of prostate cancer, status post biopsy. No prior therapy. EXAM: NUCLEAR MEDICINE PET SKULL BASE TO THIGH TECHNIQUE: 9.9 mCi F18 Piflufolastat (Pylarify) was injected intravenously. Full-ring PET imaging was performed from the skull base to thigh after the radiotracer. CT data was obtained and used for attenuation correction and anatomic localization. COMPARISON:  10/04/2022 prostate MRI.  Abdominal CT 10/21/2007 FINDINGS: NECK No radiotracer activity in neck lymph nodes. Incidental CT finding: No cervical adenopathy. CHEST No radiotracer accumulation within mediastinal or hilar lymph nodes. No suspicious pulmonary nodules on the CT scan. Incidental CT finding: Moderate right hemidiaphragm elevation. ABDOMEN/PELVIS Prostate: Right posterolateral prostatic tracer affinity at a S.U.V. max of 7.2. This is relatively diffuse from base to apex. Left posterolateral mid gland more faint tracer affinity at a S.U.V. max of 4.4. Lymph nodes: No abnormal radiotracer accumulation within pelvic or abdominal nodes. Physiologic tracer affinity within the celiac ganglion on 141/4. Liver: No evidence of liver metastasis. Incidental CT finding: Abnormal appearance of the left kidney, similar to the imaged portions back in 2009. Likely related to a chronic ureteropelvic junction obstruction with resultant marked cortical thinning. Mild hepatic steatosis with caudate lobe enlargement and mild medial segment left liver lobe atrophy. The medial segment left liver lobe atrophy is significantly increased from 2009. Scattered colonic diverticula. Normal adrenal glands. Abdominal aortic atherosclerosis. Tiny fat containing right inguinal hernia. SKELETON No focal activity to suggest skeletal metastasis. Left iliac non tracer avid sclerotic lesion of 12 mm is likely a bone island. IMPRESSION: 1. Right-greater-than-left prostatic tracer affinity, consistent with right-sided disease. No MR  correlate for the left-sided tracer affinity. 2. No evidence of tracer avid nodal or osseous metastasis. 3. 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 4. Mild hepatic steatosis. Hepatic morphology for which mild cirrhosis is a concern. EMR history describes 9 standard alcoholic drinks per week. 5.  Aortic Atherosclerosis (ICD10-I70.0). Electronically Signed   By:  Jeronimo Greaves M.D.   On: 12/16/2022 16:39      IMPRESSION/PLAN: 1. 62 y.o. gentleman with likely locally advanced, Stage T1c (RAD T3) adenocarcinoma of the prostate with Gleason Score of 4+3, and PSA of 2.45. We discussed the patient's workup and outlined the nature of prostate cancer in this setting. Marcus Kramer's T stage, Gleason's score, PSA, and MRI findings put him into the high risk group and biopsy correlates with prostate MRI findings suggest a high likelihood for locally advanced disease with extraprostatic extension on the right. Accordingly, he is eligible for a variety of potential treatment options including prostatectomy or ADT in combination with either 2 years of ADT with 8 weeks of external radiation or 6 months of ADT with 5 weeks of external radiation with an upfront brachytherapy boost. We discussed the available radiation techniques, and focused on the details and logistics of delivery. We discussed and outlined the risks, benefits, short and long-term effects associated with radiotherapy and compared and contrasted these with prostatectomy. We discussed the role of SpaceOAR gel in reducing the rectal toxicity associated with radiotherapy. We also detailed the role of ADT in the treatment of unfavorable intermediate risk prostate cancer and outlined the associated side effects that could be expected with this therapy.  He appears to have a good understanding of his disease and our treatment recommendations which are of curative intent.  He was encouraged to ask  questions that were answered to his stated satisfaction.  At the conclusion of our conversation, Marcus Kramer would like to think about his options. He is deciding between surgery or 6 months of ADT with either 8 weeks IMRT or 5 weeks of external radiation and an upfront brachytherapy boost. Our nurse navigator, Marcus Kramer, is out of the office today, but she will call him tomorrow morning to get in contact with him. Patient will contact Marcus Kramer when he has decided which treatment option he would like to proceed with.   Patient is a high risk cancer patient and has an extensive family history of cancer. He is a good candidate for genetic testing. Referral made to genetics today.   We personally spent 70 minutes in this encounter including chart review, reviewing radiological studies, meeting face-to-face with the patient, entering orders and completing documentation.    Joyice Faster, PA-C    Margaretmary Dys, MD  [facility]  Radiation Oncology Direct Dial: [phone]  Fax: [phone] [email or web address]  Skype  LinkedIn      ADDENDUM 03/10/23: The patient has elected to proceed with ADT plus prostate seed implant boost followed by 5 weeks of IMRT.  Plan to treat prostate to 110 Gy with I-125 brachytherapy seeds followed by 45 Gy in 25 fractions to the pelvic nodes, prostate and seminal vesicles.  Due to the high doses needed to treat the prostate, IMRT, IGRT and SpaceOAR are medically necessary to reduce the risk of rectal bleeding.

## 2022-12-18 NOTE — Progress Notes (Signed)
Spoke with patient via telephone to introduce myself as the prostate nurse navigator and discussed my role.  Patient is undecided on his treatment decision at this time.  RN provided education on radiation options.  I provided my contact information and asked for him to call me with questions or concerns.  Pt will call back with additional questions and/or decision.

## 2022-12-20 ENCOUNTER — Encounter: Payer: Self-pay | Admitting: Radiation Oncology

## 2022-12-23 NOTE — Progress Notes (Signed)
Patient called up update RN that his PCP has placed a referral for him to be seen at [facility] for second opinion.    RN provided instructions on contact [facility] to have them fax over records.  Pt understandable and plans to follow up RN with additional information/treatment decision.

## 2023-01-15 ENCOUNTER — Other Ambulatory Visit: Payer: Self-pay | Admitting: Urology

## 2023-01-16 ENCOUNTER — Telehealth: Payer: Self-pay | Admitting: *Deleted

## 2023-01-16 NOTE — Telephone Encounter (Signed)
Called patient to inform of pre-seed appts. and his implant, spoke with patient and he is aware of these appts 

## 2023-01-28 ENCOUNTER — Inpatient Hospital Stay: Payer: BC Managed Care – PPO

## 2023-01-28 ENCOUNTER — Inpatient Hospital Stay: Payer: BC Managed Care – PPO | Attending: Genetic Counselor | Admitting: Genetic Counselor

## 2023-01-28 ENCOUNTER — Encounter: Payer: Self-pay | Admitting: Genetic Counselor

## 2023-01-28 ENCOUNTER — Other Ambulatory Visit: Payer: Self-pay | Admitting: Genetic Counselor

## 2023-01-28 DIAGNOSIS — C61 Malignant neoplasm of prostate: Secondary | ICD-10-CM

## 2023-01-28 DIAGNOSIS — Z8042 Family history of malignant neoplasm of prostate: Secondary | ICD-10-CM | POA: Diagnosis not present

## 2023-01-28 DIAGNOSIS — Z808 Family history of malignant neoplasm of other organs or systems: Secondary | ICD-10-CM | POA: Diagnosis not present

## 2023-01-28 DIAGNOSIS — Z8051 Family history of malignant neoplasm of kidney: Secondary | ICD-10-CM | POA: Diagnosis not present

## 2023-01-28 LAB — GENETIC SCREENING ORDER

## 2023-01-28 NOTE — Progress Notes (Signed)
REFERRING PROVIDER: Heloise Purpura, MD [address] [place],  [place] [address]  PRIMARY PROVIDER:  Danella Penton, MD  PRIMARY REASON FOR VISIT:  1. Family history of glioblastoma   2. Family history of kidney cancer   3. Malignant neoplasm of prostate   4. Family history of prostate cancer      HISTORY OF PRESENT ILLNESS:   Marcus Kramer, a 62 y.o. male, was seen for a [place] cancer genetics consultation at the request of Dr. Laverle Patter due to a personal and family history of cancer.  Marcus Kramer presents to clinic today to discuss the possibility of a hereditary predisposition to cancer, genetic testing, and to further clarify his future cancer risks, as well as potential cancer risks for family members.   In February 2024, at the age of 69, Marcus Kramer was diagnosed with prostate cancer. The Gleason score = 8. He has had a colonoscopy in the past and has had several colon polyps.  Marcus Kramer provided a copy of his relatives genetic testing which found an MSH2 Deletion Exon 1-6.     CANCER HISTORY:  Oncology History  Malignant neoplasm of prostate  11/06/2022 Cancer Staging   Staging form: Prostate, AJCC 8th Edition - Clinical stage from 11/06/2022: Stage IIC (cT1c, cN0, cM0, PSA: 2.5, Grade Group: 3) - Signed by Marcello Fennel, PA-C on 12/17/2022 Histopathologic type: Adenocarcinoma, NOS Stage prefix: Initial diagnosis Prostate specific antigen (PSA) range: Less than 10 Gleason primary pattern: 4 Gleason secondary pattern: 3 Gleason score: 7 Histologic grading system: 5 grade system Number of biopsy cores examined: 16 Number of biopsy cores positive: 10 Location of positive needle core biopsies: Both sides   12/17/2022 Initial Diagnosis   Malignant neoplasm of prostate [facility])     Past Medical History:  Diagnosis Date   Anxiety    ED (erectile dysfunction)    ED (erectile dysfunction)    Elevated PSA 11/06/2022   10/28/2022   Family history of colon cancer    Family history of  glioblastoma    Family history of kidney cancer    GERD (gastroesophageal reflux disease)    Gout    Gout    History of skin cancer    Hyperlipidemia    Hypertension     Past Surgical History:  Procedure Laterality Date   COLONOSCOPY     EYE SURGERY Right    HERNIA REPAIR     NOSE SURGERY     PROSTATE BIOPSY     PROSTATE BIOPSY     VASECTOMY      Social History   Socioeconomic History   Marital status: Married    Spouse name: Not on file   Number of children: Not on file   Years of education: Not on file   Highest education level: Not on file  Occupational History   Not on file  Tobacco Use   Smoking status: Every Day    Types: Cigars   Smokeless tobacco: Never   Tobacco comments:    occasional, none in past year  Vaping Use   Vaping Use: Never used  Substance and Sexual Activity   Alcohol use: Yes    Alcohol/week: 9.0 standard drinks of alcohol    Types: 9 Standard drinks or equivalent per week    Comment: 10 or less drinks per week   Drug use: Never   Sexual activity: Yes  Other Topics Concern   Not on file  Social History Narrative   Not on file  Social Determinants of Health   Financial Resource Strain: Not on file  Food Insecurity: No Food Insecurity (12/17/2022)   Hunger Vital Sign    Worried About Running Out of Food in the Last Year: Never true    Ran Out of Food in the Last Year: Never true  Transportation Needs: No Transportation Needs (12/17/2022)   PRAPARE - Administrator, Civil Service (Medical): No    Lack of Transportation (Non-Medical): No  Physical Activity: Not on file  Stress: Not on file  Social Connections: Not on file     FAMILY HISTORY:  We obtained a detailed, 4-generation family history.  Significant diagnoses are listed below: Family History  Problem Relation Age of Onset   Brain cancer Mother 76   Colon cancer Mother 81       2nd at 51   Other Mother 63       'male cancer"   Kidney cancer Mother 43    Stroke Father    Parkinson's disease Father    Prostate cancer Father        prostate cancer x2   Melanoma Brother 64   Brain cancer Maternal Grandmother 49       glioblastoma   Heart attack Maternal Grandfather    Lung cancer Maternal Grandfather    Parkinson's disease Paternal Grandfather    Stroke Paternal Grandfather       The patient has two biological children and two adopted.  One biological daughter had lymphoma at 35.  He has a brother who had melanoma at 54.  Both parents are deceased.  The patient's father had prostate cancer twice.  His parents died of non cancer related issues.  The patient's mother had a male cancer at 32, colon cancer at 52 and 8, kidney cancer at 12 and glioblastoma at 58. She had a sister who is cancer free.  The maternal grandparents are deceased.  The grandmother had glioblastoma and died at 33.  She had two sisters with cancer in their 19's.  There is a distant relative with a diagnosis of Lynch syndrome.  Marcus Kramer is aware of previous family history of genetic testing for hereditary cancer risks. Patient's maternal ancestors are of [place] and [place] descent, and paternal ancestors are of [place] descent. There is no reported Ashkenazi Jewish ancestry. There is no known consanguinity.  GENETIC COUNSELING ASSESSMENT: Mr. Hann is a 62 y.o. male with a personal and family history of cancer which is somewhat suggestive of a hereditary cancer syndrome, including Lynch syndrome and predisposition to cancer given the known diagnosis of lynch syndrome in the family, combination of cancers in the family and young ages of onset. We, therefore, discussed and recommended the following at today's visit.   DISCUSSION: We discussed that, in general, most cancer is not inherited in families, but instead is sporadic or familial. Sporadic cancers occur by chance and typically happen at older ages (>50 years) as this type of cancer is caused by genetic changes acquired  during an individual's lifetime. Some families have more cancers than would be expected by chance; however, the ages or types of cancer are not consistent with a known genetic mutation or known genetic mutations have been ruled out. This type of familial cancer is thought to be due to a combination of multiple genetic, environmental, hormonal, and lifestyle factors. While this combination of factors likely increases the risk of cancer, the exact source of this risk is not currently identifiable or testable.  We discussed that up to 15% of prostate cancer is hereditary, most commonly due to BRCA mutations.  There are other genes associated with hereditary prostate cancer, including ATM, CHEK2 and PALB2.  We can also see prostate cancer as a part of Lynch syndrome. The patient's mother has a family history of Lynch syndrome, and although she was never tested, had a cancer history suspicious of Lynch syndrome.  She had either uterine or ovarian cancer, two colon cancers, kidney cancer and a glioblastoma.  When an individuals has a hereditary colon cancer syndrome, the majority of the time it is Lynch syndrome. Marcus Kramer indicated that he had a distant relative who tested positive for MSH2 Deletion Exon 1-6.  Based on his mother's diagnosis, it is suggestive that she has this mutation.  Based on this we estimate that Marcus Kramer has a 50% chance of having Lynch syndrome.  We discussed that testing is beneficial for several reasons including knowing how to follow individuals after completing their treatment, identifying whether potential treatment options such as PARP inhibitors would be beneficial, and understand if other family members could be at risk for cancer and allow them to undergo genetic testing.   We reviewed the characteristics, features and inheritance patterns of hereditary cancer syndromes. We also discussed genetic testing, including the appropriate family members to test, the process of testing,  insurance coverage and turn-around-time for results. We discussed the implications of a negative, positive, carrier and/or variant of uncertain significant result. Marcus Kramer  was offered a common hereditary cancer panel (47 genes) and an expanded pan-cancer panel (77 genes). Marcus Kramer was informed of the benefits and limitations of each panel, including that expanded pan-cancer panels contain genes that do not have clear management guidelines at this point in time.  We also discussed that as the number of genes included on a panel increases, the chances of variants of uncertain significance increases. Marcus Kramer decided to pursue genetic testing for the Multi-Cancer panel + RNA gene panel.   The Multi-Cancer + RNA Panel offered by [facility] includes sequencing and/or deletion/duplication analysis of the following 70 genes:  AIP*, ALK, APC*, ATM*, AXIN2*, BAP1*, BARD1*, BLM*, BMPR1A*, BRCA1*, BRCA2*, BRIP1*, CDC73*, CDH1*, CDK4, CDKN1B*, CDKN2A, CHEK2*, CTNNA1*, DICER1*, EPCAM (del/dup only), EGFR, FH*, FLCN*, GREM1 (promoter dup only), HOXB13, KIT, LZTR1, MAX*, MBD4, MEN1*, MET, MITF, MLH1*, MSH2*, MSH3*, MSH6*, MUTYH*, NF1*, NF2*, NTHL1*, PALB2*, PDGFRA, PMS2*, POLD1*, POLE*, POT1*, PRKAR1A*, PTCH1*, PTEN*, RAD51C*, RAD51D*, RB1*, RET, SDHA* (sequencing only), SDHAF2*, SDHB*, SDHC*, SDHD*, SMAD4*, SMARCA4*, SMARCB1*, SMARCE1*, STK11*, SUFU*, TMEM127*, TP53*, TSC1*, TSC2*, VHL*. RNA analysis is performed for * genes.   Based on Marcus Kramer's personal and family history of cancer, he meets medical criteria for genetic testing. Despite that he meets criteria, he may still have an out of pocket cost. We discussed that if his out of pocket cost for testing is over $100, the laboratory will call and confirm whether he wants to proceed with testing.  If the out of pocket cost of testing is less than $100 he will be billed by the genetic testing laboratory.   We discussed that some people do not want to undergo genetic  testing due to fear of genetic discrimination.  The Genetic Information Nondiscrimination Act (GINA) was signed into federal law in 2008. GINA prohibits health insurers and most employers from discriminating against individuals based on genetic information (including the results of genetic tests and family history information). According to GINA, health insurance companies cannot consider genetic information  to be a preexisting condition, nor can they use it to make decisions regarding coverage or rates. GINA also makes it illegal for most employers to use genetic information in making decisions about hiring, firing, promotion, or terms of employment. It is important to note that GINA does not offer protections for life insurance, disability insurance, or long-term care insurance. GINA does not apply to those in the [employer], those who work for companies with less than 15 employees, and new life insurance or long-term disability insurance policies.  Health status due to a cancer diagnosis is not protected under GINA. More information about GINA can be found by visiting [email or web address].  PLAN: After considering the risks, benefits, and limitations, Marcus Kramer provided informed consent to pursue genetic testing and the blood sample was sent to [facility] for analysis of the Multi-cancer +RNA panel. Results should be available within approximately 2-3 weeks' time, at which point they will be disclosed by telephone to Marcus Kramer, as will any additional recommendations warranted by these results. Marcus Kramer will receive a summary of his genetic counseling visit and a copy of his results once available. This information will also be available in Epic.   Lastly, we encouraged Marcus Kramer to remain in contact with cancer genetics annually so that we can continuously update the family history and inform him of any changes in cancer genetics and testing that may be of benefit for this family.   Marcus Kramer  questions were answered to his satisfaction today. Our contact information was provided should additional questions or concerns arise. Thank you for the referral and allowing [place] to share in the care of your patient.   Hideko Esselman P. Lowell Guitar, MS, [facility] Licensed, Patent attorney [email or web address] phone: [phone]  The patient was seen for a total of 45 minutes in face-to-face genetic counseling.  The patient was seen alone.  Drs. Meliton Rattan, and/or [place] were available for questions, if needed..    _______________________________________________________________________ For Office Staff:  Number of people involved in session: 1 Was an Intern/ student involved with case: no

## 2023-01-29 NOTE — Addendum Note (Signed)
Encounter addended by: Margaretmary Dys, MD on: 01/29/2023 2:15 PM  Actions taken: Problem List reviewed, Medication List reviewed, Allergies reviewed, Clinical Note Signed

## 2023-02-03 ENCOUNTER — Other Ambulatory Visit: Payer: BC Managed Care – PPO

## 2023-02-03 ENCOUNTER — Encounter: Payer: BC Managed Care – PPO | Admitting: Genetic Counselor

## 2023-02-05 NOTE — Progress Notes (Signed)
RN spoke with patient to update him on additional information submitted to [employer] to cover [employer] for upcoming brachyboost.   No additional needs at this time.

## 2023-02-10 ENCOUNTER — Telehealth: Payer: Self-pay | Admitting: *Deleted

## 2023-02-10 NOTE — Telephone Encounter (Signed)
CALLED PATIENT TO REMIND OF PRE-SEED APPTS. FOR 02-12-23, SPOKE WITH PATIENT AND HE IS AWARE OF THESE APPTS.

## 2023-02-11 NOTE — Progress Notes (Signed)
  Radiation Oncology         [phone] ________________________________  Name: Marcus Kramer MRN: [id number]  Date: 02/12/2023  DOB: 1960/10/29  SIMULATION AND TREATMENT PLANNING NOTE PUBIC ARCH STUDY  [id number], Hardin Negus, MD  Heloise Purpura, MD  DIAGNOSIS:  62 y.o. 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   Oncology History  Malignant neoplasm of prostate (HCC)  11/06/2022 Cancer Staging   Staging form: Prostate, AJCC 8th Edition - Clinical stage from 11/06/2022: Stage IIC (cT1c, cN0, cM0, PSA: 2.5, Grade Group: 3) - Signed by Marcello Fennel, PA-C on 12/17/2022 Histopathologic type: Adenocarcinoma, NOS Stage prefix: Initial diagnosis Prostate specific antigen (PSA) range: Less than 10 Gleason primary pattern: 4 Gleason secondary pattern: 3 Gleason score: 7 Histologic grading system: 5 grade system Number of biopsy cores examined: 16 Number of biopsy cores positive: 10 Location of positive needle core biopsies: Both sides   12/17/2022 Initial Diagnosis   Malignant neoplasm of prostate (HCC)       ICD-10-CM   1. Malignant neoplasm of prostate (HCC)  C61       COMPLEX SIMULATION:  The patient presented today for evaluation for possible prostate seed implant. He was brought to the radiation planning suite and placed supine on the CT couch. A 3-dimensional image study set was obtained in upload to the planning computer. There, on each axial slice, I contoured the prostate gland. Then, using three-dimensional radiation planning tools I reconstructed the prostate in view of the structures from the transperineal needle pathway to assess for possible pubic arch interference. In doing so, I did not appreciate any pubic arch interference. Also, the patient's prostate volume was estimated based on the drawn structure. The volume was 18 cc.  Given the pubic arch appearance and prostate volume, patient  remains a good candidate to proceed with prostate seed implant. Today, he freely provided informed written consent to proceed.    PLAN: The patient will undergo prostate seed implant boost to be followed by IMRT.   ________________________________  Artist Pais. Kathrynn Running, M.D.

## 2023-02-12 ENCOUNTER — Ambulatory Visit
Admission: RE | Admit: 2023-02-12 | Discharge: 2023-02-12 | Disposition: A | Discharge status: Home / Self Care | Payer: BC Managed Care – PPO | Source: Ambulatory Visit | Attending: Urology | Admitting: Urology

## 2023-02-12 ENCOUNTER — Other Ambulatory Visit: Payer: Self-pay

## 2023-02-12 ENCOUNTER — Encounter: Payer: Self-pay | Admitting: Urology

## 2023-02-12 ENCOUNTER — Ambulatory Visit
Admission: RE | Admit: 2023-02-12 | Discharge: 2023-02-12 | Disposition: A | Discharge status: Home / Self Care | Payer: BC Managed Care – PPO | Source: Ambulatory Visit | Attending: Radiation Oncology | Admitting: Radiation Oncology

## 2023-02-12 ENCOUNTER — Encounter (HOSPITAL_COMMUNITY)
Admission: RE | Admit: 2023-02-12 | Discharge: 2023-02-12 | Disposition: A | Discharge status: Home / Self Care | Payer: BC Managed Care – PPO | Source: Ambulatory Visit | Attending: Urology | Admitting: Urology

## 2023-02-12 VITALS — Resp 19 | Ht 70.0 in | Wt 212.0 lb

## 2023-02-12 DIAGNOSIS — Z01810 Encounter for preprocedural cardiovascular examination: Secondary | ICD-10-CM | POA: Insufficient documentation

## 2023-02-12 DIAGNOSIS — C61 Malignant neoplasm of prostate: Secondary | ICD-10-CM

## 2023-02-12 DIAGNOSIS — Z510 Encounter for antineoplastic radiation therapy: Secondary | ICD-10-CM | POA: Insufficient documentation

## 2023-02-12 DIAGNOSIS — Z01818 Encounter for other preprocedural examination: Secondary | ICD-10-CM

## 2023-02-12 NOTE — Progress Notes (Signed)
Radiation Oncology         [phone]) [phone] ________________________________  Outpatient Follow up- Pre-seed visit  Name: Marcus Kramer MRN: [id number]  Date: 02/12/2023  DOB: 1960/12/16  XB:JYNWGN, Hardin Negus, MD  Heloise Purpura, MD   REFERRING PHYSICIAN: Heloise Purpura, MD  DIAGNOSIS: 62 y.o. 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     ICD-10-CM   1. Malignant neoplasm of prostate (HCC)  C61       HISTORY OF PRESENT ILLNESS: Marcus Kramer is a 62 y.o. male with a diagnosis of prostate cancer. Marcus Kramer was noted to have a rising PSA of 2.45, on routine labs with his primary care physician, Dr. Hyacinth Meeker.  He has a strong paternal family history of prostate cancer and has been undergoing PSA screening every 6 months with a baseline PSA between 1.6 -2.0.  The elevation in PSA prompted further evaluation with a prostate MRI which was performed on 10/07/2022 and showed an 18 mm PI-RADS 5 lesion on the right, from apex to base, with possible extracapsular extension and neurovascular bundle involvement.  There was no concerning lymphadenopathy but they did comment on an indeterminate sclerotic lesion in the left iliac bone.  Accordingly, he was referred for evaluation in urology by Dr. Laverle Patter on 10/28/2022,  digital rectal examination was performed at that time revealing very slight induration on the right, not particularly suspicious.  The patient proceeded to MRI fusion transrectal ultrasound with 16 biopsies of the prostate on 11/06/2022.  The prostate volume measured 24 cc.  Out of 16 core biopsies, 10 were positive.  The maximum Gleason score was 4+3, and this was seen in all 4/4 samples from the MRI ROI and in the right mid lateral core.  Additionally, Gleason 3+4 was seen in the right mid, right apex and right apex lateral and Gleason 3+3 in the left mid and right base lateral.   He had a PSMA PET scan for  disease staging on 12/15/2022 and this was negative for any evidence of osseous or visceral metastases.  The patient reviewed the biopsy results with his urologist and was kindly referred to [place] for discussion of potential radiation treatment options. We initially met the patient on 12/17/22 and he was undecided regarding his treatment preference at the time but after careful consideration of his options, he has decided that he would like to proceed with LT-ADT concurrent with a brachytherapy seed boost followed by 5 weeks of daily external beam radiotherapy for treatment of his disease. He is here today for his pre-procedure imaging for planning and to answer any additional questions he may have about this treatment.   PREVIOUS RADIATION THERAPY: No  PAST MEDICAL HISTORY:  Past Medical History:  Diagnosis Date   Anxiety    ED (erectile dysfunction)    ED (erectile dysfunction)    Elevated PSA 11/06/2022   10/28/2022   Family history of colon cancer    Family history of glioblastoma    Family history of kidney cancer    GERD (gastroesophageal reflux disease)    Gout    Gout    History of skin cancer    Hyperlipidemia    Hypertension       PAST SURGICAL HISTORY: Past Surgical History:  Procedure Laterality Date   COLONOSCOPY     EYE SURGERY Right    HERNIA REPAIR     NOSE SURGERY  PROSTATE BIOPSY     PROSTATE BIOPSY     VASECTOMY      FAMILY HISTORY:  Family History  Problem Relation Age of Onset   Brain cancer Mother 91   Colon cancer Mother 21       2nd at 10   Other Mother 62       'male cancer"   Kidney cancer Mother 43   Stroke Father    Parkinson's disease Father    Prostate cancer Father        prostate cancer x2   Melanoma Brother 24   Brain cancer Maternal Grandmother 49       glioblastoma   Heart attack Maternal Grandfather    Lung cancer Maternal Grandfather    Parkinson's disease Paternal Grandfather    Stroke Paternal Grandfather     SOCIAL  HISTORY:  Social History   Socioeconomic History   Marital status: Married    Spouse name: Not on file   Number of children: Not on file   Years of education: Not on file   Highest education level: Not on file  Occupational History   Not on file  Tobacco Use   Smoking status: Every Day    Types: Cigars   Smokeless tobacco: Never   Tobacco comments:    occasional, none in past year  Vaping Use   Vaping Use: Never used  Substance and Sexual Activity   Alcohol use: Yes    Alcohol/week: 9.0 standard drinks of alcohol    Types: 9 Standard drinks or equivalent per week    Comment: 10 or less drinks per week   Drug use: Never   Sexual activity: Yes  Other Topics Concern   Not on file  Social History Narrative   Not on file   Social Determinants of Health   Financial Resource Strain: Not on file  Food Insecurity: No Food Insecurity (12/17/2022)   Hunger Vital Sign    Worried About Running Out of Food in the Last Year: Never true    Ran Out of Food in the Last Year: Never true  Transportation Needs: No Transportation Needs (12/17/2022)   PRAPARE - Administrator, Civil Service (Medical): No    Lack of Transportation (Non-Medical): No  Physical Activity: Not on file  Stress: Not on file  Social Connections: Not on file  Intimate Partner Violence: Not At Risk (12/17/2022)   Humiliation, Afraid, Rape, and Kick questionnaire    Fear of Current or Ex-Partner: No    Emotionally Abused: No    Physically Abused: No    Sexually Abused: No    ALLERGIES: Sulfa antibiotics  MEDICATIONS:  Current Outpatient Medications  Medication Sig Dispense Refill   allopurinol (ZYLOPRIM) 300 MG tablet TAKE ONE TABLET EVERY DAY     diclofenac (VOLTAREN) 75 MG EC tablet Take 75 mg by mouth 2 (two) times daily.     DIPHENHYDRAMINE-ACETAMINOPHEN PO Take by mouth.     losartan (COZAAR) 100 MG tablet Take 100 mg by mouth daily.     Multiple Vitamin (MULTI-VITAMINS) TABS Take by mouth.      omeprazole (PRILOSEC) 40 MG capsule TAKE 1 CAPSULE BY MOUTH EVERY MORNING FOR ACID     sildenafil (REVATIO) 20 MG tablet TAKE ONE TABLET 3 TIMES DAILY AS NEEDED     venlafaxine XR (EFFEXOR-XR) 75 MG 24 hr capsule Take 75 mg by mouth daily.     No current facility-administered medications for this encounter.  REVIEW OF SYSTEMS:  On review of systems, Marcus Kramer reports that he is doing well overall. He denies any chest pain, shortness of breath, cough, fevers, chills, night sweats, unintended weight changes. He denies any bowel disturbances, and denies abdominal pain, nausea or vomiting. He denies any new musculoskeletal or joint aches or pains. His IPSS was 7, indicating mild urinary symptoms. His SHIM was 24, indicating he does not have erectile dysfunction. A complete review of systems is obtained and is otherwise negative.    PHYSICAL EXAM:  Wt Readings from Last 3 Encounters:  02/12/23 212 lb (96.2 kg)  12/17/22 213 lb 2 oz (96.7 kg)  09/02/17 208 lb 9.6 oz (94.6 kg)   Temp Readings from Last 3 Encounters:  12/17/22 (!) 96.9 F (36.1 C) (Temporal)   BP Readings from Last 3 Encounters:  12/17/22 (!) 139/90   Pulse Readings from Last 3 Encounters:  12/17/22 84   Pain Assessment Pain Score: 0-No pain/10  In general this is a well appearing Caucasian male in no acute distress. He's alert and oriented x4 and appropriate throughout the examination. Cardiopulmonary assessment is negative for acute distress, and he exhibits normal effort.     KPS = 100  100 - Normal; no complaints; no evidence of disease. 90   - Able to carry on normal activity; minor signs or symptoms of disease. 80   - Normal activity with effort; some signs or symptoms of disease. 16   - Cares for self; unable to carry on normal activity or to do active work. 60   - Requires occasional assistance, but is able to care for most of his personal needs. 50   - Requires considerable assistance and frequent  medical care. 40   - Disabled; requires special care and assistance. 30   - Severely disabled; hospital admission is indicated although death not imminent. 20   - Very sick; hospital admission necessary; active supportive treatment necessary. 10   - Moribund; fatal processes progressing rapidly. 0     - Dead  Karnofsky DA, Abelmann WH, Craver LS and Burchenal JH [phone]) The use of the nitrogen mustards in the palliative treatment of carcinoma: with particular reference to bronchogenic carcinoma Cancer 1 [phone]  LABORATORY DATA:  No results found for: "WBC", "HGB", "HCT", "MCV", "PLT" No results found for: "NA", "K", "CL", "CO2" No results found for: "ALT", "AST", "GGT", "ALKPHOS", "BILITOT"   RADIOGRAPHY: No results found.    IMPRESSION/PLAN: 1. 62 y.o. 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  The patient has elected to proceed with LT-ADT concurrent with a brachytherapy seed boost followed by 5 weeks of daily external beam radiotherapy for treatment of his disease. We reviewed the risks, benefits, short and long-term effects associated with brachytherapy and discussed the role of SpaceOAR in reducing the rectal toxicity associated with radiotherapy.  He appears to have a good understanding of his disease and our treatment recommendations which are of curative intent.  He was encouraged to ask questions that were answered to his stated satisfaction. He has freely signed written consent to proceed today in the office and a copy of this document will be placed in his medical record. His procedure is tentatively scheduled for 04/02/23 in collaboration with Dr. Laverle Patter and we will see him back for his post-procedure visit approximately 2-3 weeks thereafter to begin treatment planning for the 5 week course of daily prostate/pelvic radiation at  that time. We look forward to continuing to participate in his care. He  knows that he is welcome to call with any questions or concerns at any time in the interim.  I personally spent 30 minutes in this encounter including chart review, reviewing radiological studies, meeting face-to-face with the patient, entering orders and completing documentation.    Marguarite Arbour, MMS, PA-C [facility] Radiation Oncology Physician Assistant Direct Dial: [phone]  Fax: [phone]

## 2023-02-12 NOTE — Progress Notes (Signed)
Pre-seed nursing interview for a diagnosis of locally advanced, Stage T1c (radT3) adenocarcinoma of the prostate with Gleason score of 4+3, and PSA of 2.45.  Patient identity verified x2. Patient reports doing well. No issues conveyed at this time.  Meaningful use complete.  Urinary Management medication(s)- None Urology appointment date- 03/24/2023, with Dr. Laverle Patter at [facility] [place]  Resp 19   Ht 5\' 10"  (1.778 m)   Wt 212 lb (96.2 kg)   BMI 30.42 kg/m   This concludes the interview.   Ruel Favors, LPN

## 2023-02-13 ENCOUNTER — Ambulatory Visit: Payer: Self-pay | Admitting: Genetic Counselor

## 2023-02-13 ENCOUNTER — Telehealth: Payer: Self-pay | Admitting: Genetic Counselor

## 2023-02-13 ENCOUNTER — Encounter: Payer: Self-pay | Admitting: Genetic Counselor

## 2023-02-13 DIAGNOSIS — Z1379 Encounter for other screening for genetic and chromosomal anomalies: Secondary | ICD-10-CM | POA: Insufficient documentation

## 2023-02-13 NOTE — Progress Notes (Signed)
HPI:  Marcus Kramer was previously seen in the [facility] due to a personal and family history of cancer and known family mutation in MSH2 and concerns regarding a hereditary predisposition to cancer. Please refer to our prior cancer genetics clinic note for more information regarding our discussion, assessment and recommendations, at the time. Marcus Kramer recent genetic test results were disclosed to him, as were recommendations warranted by these results. These results and recommendations are discussed in more detail below.  CANCER HISTORY:  Oncology History  Malignant neoplasm of prostate (HCC)  11/06/2022 Cancer Staging   Staging form: Prostate, AJCC 8th Edition - Clinical stage from 11/06/2022: Stage IIC (cT1c, cN0, cM0, PSA: 2.5, Grade Group: 3) - Signed by Marcello Fennel, PA-C on 12/17/2022 Histopathologic type: Adenocarcinoma, NOS Stage prefix: Initial diagnosis Prostate specific antigen (PSA) range: Less than 10 Gleason primary pattern: 4 Gleason secondary pattern: 3 Gleason score: 7 Histologic grading system: 5 grade system Number of biopsy cores examined: 16 Number of biopsy cores positive: 10 Location of positive needle core biopsies: Both sides   12/17/2022 Initial Diagnosis   Malignant neoplasm of prostate (HCC)   02/13/2023 Genetic Testing   Negative genetic testing on the Multi-cancer + RNA gene panel.  Specifically, the MSH2 Exon 1-6 del familial mutation was not identified.  The report date is Feb 13, 2023.  The Multi-Cancer + RNA Panel offered by [facility] includes sequencing and/or deletion/duplication analysis of the following 70 genes:  AIP*, ALK, APC*, ATM*, AXIN2*, BAP1*, BARD1*, BLM*, BMPR1A*, BRCA1*, BRCA2*, BRIP1*, CDC73*, CDH1*, CDK4, CDKN1B*, CDKN2A, CHEK2*, CTNNA1*, DICER1*, EPCAM (del/dup only), EGFR, FH*, FLCN*, GREM1 (promoter dup only), HOXB13, KIT, LZTR1, MAX*, MBD4, MEN1*, MET, MITF, MLH1*, MSH2*, MSH3*, MSH6*, MUTYH*, NF1*, NF2*, NTHL1*,  PALB2*, PDGFRA, PMS2*, POLD1*, POLE*, POT1*, PRKAR1A*, PTCH1*, PTEN*, RAD51C*, RAD51D*, RB1*, RET, SDHA* (sequencing only), SDHAF2*, SDHB*, SDHC*, SDHD*, SMAD4*, SMARCA4*, SMARCB1*, SMARCE1*, STK11*, SUFU*, TMEM127*, TP53*, TSC1*, TSC2*, VHL*. RNA analysis is performed for * genes.      FAMILY HISTORY:  We obtained a detailed, 4-generation family history.  Significant diagnoses are listed below: Family History  Problem Relation Age of Onset   Brain cancer Mother 44   Colon cancer Mother 67       2nd at 68   Other Mother 60       'male cancer"   Kidney cancer Mother 38   Stroke Father    Parkinson's disease Father    Prostate cancer Father        prostate cancer x2   Melanoma Brother 71   Brain cancer Maternal Grandmother 49       glioblastoma   Heart attack Maternal Grandfather    Lung cancer Maternal Grandfather    Parkinson's disease Paternal Grandfather    Stroke Paternal Grandfather        The patient has two biological children and two adopted.  One biological daughter had lymphoma at 63.  He has a brother who had melanoma at 30.  Both parents are deceased.   The patient's father had prostate cancer twice.  His parents died of non cancer related issues.   The patient's mother had a male cancer at 4, colon cancer at 44 and 8, kidney cancer at 13 and glioblastoma at 44. She had a sister who is cancer free.  The maternal grandparents are deceased.  The grandmother had glioblastoma and died at 4.  She had two sisters with cancer in their 4's.  There is a distant relative  with a diagnosis of Lynch syndrome.   Marcus Kramer is aware of previous family history of genetic testing for hereditary cancer risks. Patient's maternal ancestors are of [place] and [place] descent, and paternal ancestors are of [place] descent. There is no reported Ashkenazi Jewish ancestry. There is no known consanguinity  GENETIC TEST RESULTS: Genetic testing reported out on Feb 13, 2023 through the  Multi-cancer + RNA gene cancer panel found no pathogenic mutations. Specifically,Marcus Kramer's test was normal and did not reveal the familial Lynch syndrome mutation. We call this result a true negative result because the cancer-causing mutation was identified in Marcus Kramer family, and he did not inherit it.  Given this negative result, Marcus Kramer chances of developing Lynch-related cancers are the same as they are in the general population.  The Multi-Cancer + RNA Panel offered by [facility] includes sequencing and/or deletion/duplication analysis of the following 70 genes:  AIP*, ALK, APC*, ATM*, AXIN2*, BAP1*, BARD1*, BLM*, BMPR1A*, BRCA1*, BRCA2*, BRIP1*, CDC73*, CDH1*, CDK4, CDKN1B*, CDKN2A, CHEK2*, CTNNA1*, DICER1*, EPCAM (del/dup only), EGFR, FH*, FLCN*, GREM1 (promoter dup only), HOXB13, KIT, LZTR1, MAX*, MBD4, MEN1*, MET, MITF, MLH1*, MSH2*, MSH3*, MSH6*, MUTYH*, NF1*, NF2*, NTHL1*, PALB2*, PDGFRA, PMS2*, POLD1*, POLE*, POT1*, PRKAR1A*, PTCH1*, PTEN*, RAD51C*, RAD51D*, RB1*, RET, SDHA* (sequencing only), SDHAF2*, SDHB*, SDHC*, SDHD*, SMAD4*, SMARCA4*, SMARCB1*, SMARCE1*, STK11*, SUFU*, TMEM127*, TP53*, TSC1*, TSC2*, VHL*. RNA analysis is performed for * genes. The test report has been scanned into EPIC and is located under the Molecular Pathology section of the Results Review tab.  A portion of the result report is included below for reference.     We discussed with Marcus Kramer that because current genetic testing is not perfect, it is possible there may be a gene mutation in one of these genes that current testing cannot detect, but that chance is small.  We also discussed, that there could be another gene that has not yet been discovered, or that we have not yet tested, that is responsible for the cancer diagnoses in the family. It is also possible there is a hereditary cause for the cancer in the family that Marcus Kramer did not inherit and therefore was not identified in his testing.  Therefore, it is  important to remain in touch with cancer genetics in the future so that we can continue to offer Marcus Kramer the most up to date genetic testing.   ADDITIONAL GENETIC TESTING: We discussed with Marcus Kramer that his genetic testing was fairly extensive.  If there are genes identified to increase cancer risk that can be analyzed in the future, we would be happy to discuss and coordinate this testing at that time.    CANCER SCREENING RECOMMENDATIONS: Marcus Kramer test result is considered negative (normal).  This means that we have not identified a hereditary cause for his personal history of prostate at this time, although many of the cancers in the family can be explained by the MSH2 known family mutation. Most cancers happen by chance and this negative test suggests that his cancer may fall into this category.    Possible reasons for Marcus Kramer's negative genetic test include:  1. There may be a gene mutation in one of these genes that current testing methods cannot detect but that chance is small.  2. There could be another gene that has not yet been discovered, or that we have not yet tested, that is responsible for the cancer diagnoses in the family.  3.  There may be no hereditary risk for  cancer in the family. The cancers in Marcus Kramer and/or his family may be sporadic/familial or due to other genetic and environmental factors. 4. It is also possible there is a hereditary cause for the cancer in the family that Marcus Kramer did not inherit.  Therefore, it is recommended he continue to follow the cancer management and screening guidelines provided by his oncology and primary healthcare provider. An individual's cancer risk and medical management are not determined by genetic test results alone. Overall cancer risk assessment incorporates additional factors, including personal medical history, family history, and any available genetic information that may result in a personalized plan for cancer prevention and  surveillance  RECOMMENDATIONS FOR FAMILY MEMBERS:  Individuals in this family might be at some increased risk of developing cancer, over the general population risk, simply due to the family history of cancer.  We recommended women in this family have a yearly mammogram beginning at age 66, or 2 years younger than the earliest onset of cancer, an annual clinical breast exam, and perform monthly breast self-exams. Women in this family should also have a gynecological exam as recommended by their primary provider. All family members should be referred for colonoscopy starting at age 62.  It is also possible that Marcus Kramer's mother had Lynch syndrome causing her cancer he did not inherit and therefore was not identified in him.  Based on Marcus Kramer's family history, we recommended his brother, who was diagnosed with melanoma at age 75, have genetic counseling and testing. Marcus Kramer will let us know if we can be of any assistance in coordinating genetic counseling and/or testing for this family member.   FOLLOW-UP: Lastly, we discussed with Marcus Kramer that cancer genetics is a rapidly advancing field and it is possible that new genetic tests will be appropriate for him and/or his family members in the future. We encouraged him to remain in contact with cancer genetics on an annual basis so we can update his personal and family histories and let him know of advances in cancer genetics that may benefit this family.   Our contact number was provided. Marcus Kramer questions were answered to his satisfaction, and he knows he is welcome to call us at anytime with additional questions or concerns.   Maylon Cos, MS, [facility] Licensed, Certified Genetic Counselor [email or web address]

## 2023-02-13 NOTE — Telephone Encounter (Signed)
Revealed negative genetic testing.  Discussed that we do not know why he has prostate cancer or why there is cancer in the family. While there was a distant relative with a diagnosis of Lynch syndrome, the patient also tested negative for that. It could be due to a different gene that we are not testing, or maybe our current technology may not be able to pick something up.  It will be important for him to keep in contact with genetics to keep up with whether additional testing may be needed.

## 2023-02-26 ENCOUNTER — Other Ambulatory Visit: Payer: BC Managed Care – PPO

## 2023-02-26 ENCOUNTER — Encounter: Payer: BC Managed Care – PPO | Admitting: Genetic Counselor

## 2023-03-10 NOTE — Addendum Note (Signed)
Encounter addended by: Margaretmary Dys, MD on: 03/10/2023 1:29 PM  Actions taken: Clinical Note Signed, Problem List reviewed, Medication List reviewed, Allergies reviewed

## 2023-03-16 ENCOUNTER — Encounter (HOSPITAL_BASED_OUTPATIENT_CLINIC_OR_DEPARTMENT_OTHER): Payer: Self-pay | Admitting: Urology

## 2023-03-16 NOTE — Progress Notes (Signed)
Spoke w/ via phone for pre-op interview--- pt Lab needs dos----  [facility] results------ current EKG in epic/ chart COVID test -----patient states asymptomatic no test needed Arrive at ------- 1100 on 04-02-2023 NPO after MN NO Solid Food.  Clear liquids from MN until--- 1000 Med rec completed Medications to take morning of surgery ----- prilosec, allopurinol, effexor Diabetic medication ----- n/a Patient instructed no nail polish to be worn day of surgery Patient instructed to bring photo id and insurance card day of surgery Patient aware to have Driver (ride ) / caregiver    for 24 hours after surgery -- wife, Dahlia Client Patient Special Instructions ----- will do fleet enema morning of surgery Pre-Op special Instructions ----- n/a Patient verbalized understanding of instructions that were given at this phone interview. Patient denies shortness of breath, chest pain, fever, cough at this phone interview.

## 2023-04-01 ENCOUNTER — Telehealth: Payer: Self-pay | Admitting: *Deleted

## 2023-04-01 NOTE — H&P (Signed)
Office Visit Report     03/24/2023   --------------------------------------------------------------------------------   Marcus Kramer  MRN: [id number]  DOB: 03/22/1961, 62 year old Male  SSN:    PRIMARY CARE:     REFERRING:  Crist Fat, MD  PROVIDER:  Heloise Purpura, M.D.  TREATING:  Ulyses Amor, [place]  LOCATION:  [facility] [phone]     --------------------------------------------------------------------------------   CC/HPI: Pt presents today for pre-operative history and physical exam in anticipation of brachytherapy and space oar by Dr. Laverle Patter on 04/02/23. He is doing well and is without complaint.   Pt denies F/C, HA, CP, SOB, N/V, diarrhea/constipation, back pain, flank pain, hematuria, and dysuria.     HX:   CC: Prostate Cancer   PCP: Dr. Bethann Punches   Marcus Kramer is a 62 year old gentleman who has a paternal family history of prostate cancer who has been undergoing routine PSA screening about every 6 months. His PSA recently increased to 2.45 from a baseline of 1.6 to 2.1. His PCP ordered an MRI of the prostate on 10/07/22 that demonstrated an 18 mm right sided PI-RADS 5 lesion from the apex to the base. There was also an incidental small sclerotic lesion of the left iliac bone.   Family history: Father (diagnosed in his 25s)   Imaging studies:  MRI (10/07/22): No SVI or LAD. Questionable right EPE. Indeterminate small left iliac sclerotic bone lesion.   PMH: He has a history of GERD, gout, hypertension, and depression.  PSH: Hernia repair.   TNM stage: cT1c N0 Mx  PSA: 2.45  Gleason score: 4+3=7 (GG 3)  Biopsy (11/06/22): 10/16 cores positive  Right: R lateral apex (70%, 3+4=7), R apex (30%, 3+4=7), R lateral mid (60%, 4+3=7), R mid (30%, 3+4=7), R lateral base (5%, 3+3=6)  Leftt: L mid (5%, 3+3=6)  MR lesion (right): 4/4 cores (4+3=7, 70%, 70%, 60%, 60%)  Prostate volume: 24.0 cc   Nomogram  OC disease: 47%  EPE: 50%   SVI: 9%  LNI: 9%  PFS (5 year, 10 year): 75%, 62%   Urinary function: IPSS is 4.  Erectile function: SHIM score is 21.     ALLERGIES: sulfur - unknown have not taken since childhood    MEDICATIONS: Allopurinol 300 mg tablet  Diazepam 10 mg tablet Take 10 mg 30-60 minutes prior to your procedure  Levofloxacin 750 mg tablet Please take one tablet the morning of your biopsy.  Omeprazole 40 mg capsule,delayed release  Losartan Potassium 100 mg tablet  Sildenafil Citrate 20 mg tablet  Tylenol  Venlafaxine Hcl 37.5 mg tablet     Notes: Diclofenac    GU PSH: Prostate Needle Biopsy - 11/06/2022 Vasectomy       PSH Notes: Squamous cell skin cancer on chest    NON-GU PSH: Colonoscopy Eye Surgery (Unspecified), Right Hernia Repair Nose Surgery (Unspecified) Surgical Pathology, Gross And Microscopic Examination For Prostate Needle - 11/06/2022     GU PMH: Prostate Cancer - 01/13/2023, - 11/25/2022 Elevated PSA - 11/06/2022, - 10/28/2022    NON-GU PMH: Anxiety GERD Gout Hypertension Skin Cancer, History    FAMILY HISTORY: 1 Daughter - Runs in Family 2 sons - Runs in Family Brain Cancer - Mother, Grandfather Colon Cancer - Mother Heart Attack - Grandfather Lung Cancer - Grandfather Parkinson's Disease - Grandfather, Father stroke - Surveyor, minerals, Father   SOCIAL HISTORY: Marital Status: Married Preferred Language: English; Ethnicity: Not Hispanic Or Latino; Race: White Current Smoking Status: Patient  has never smoked.   Tobacco Use Assessment Completed: Used Tobacco in last 30 days? Does not use smokeless tobacco. Social Drinker.  Patient uses recreational drugs. Uses other drugs. Drinks 4+ caffeinated drinks per day. Has not had a blood transfusion.     Notes: ETOH beer daily ( 3-4); occasionally bourbon/vodka  THC once per week    REVIEW OF SYSTEMS:    GU Review Male:   Patient denies frequent urination, hard to postpone urination, burning/ pain with urination, get  up at night to urinate, leakage of urine, stream starts and stops, trouble starting your stream, have to strain to urinate , erection problems, and penile pain.  Gastrointestinal (Upper):   Patient denies nausea, vomiting, and indigestion/ heartburn.  Gastrointestinal (Lower):   Patient denies diarrhea and constipation.  Constitutional:   Patient denies fever, night sweats, weight loss, and fatigue.  Skin:   Patient denies skin rash/ lesion and itching.  Eyes:   Patient denies blurred vision and double vision.  Ears/ Nose/ Throat:   Patient denies sore throat and sinus problems.  Hematologic/Lymphatic:   Patient denies swollen glands and easy bruising.  Cardiovascular:   Patient denies leg swelling and chest pains.  Respiratory:   Patient denies cough and shortness of breath.  Endocrine:   Patient denies excessive thirst.  Musculoskeletal:   Patient denies back pain and joint pain.  Neurological:   Patient denies headaches and dizziness.  Psychologic:   Patient denies anxiety and depression.   VITAL SIGNS:      03/24/2023 01:56 PM  Weight 204 lb / 92.53 kg  Height 70 in / 177.8 cm  BP 135/87 mmHg  Pulse 98 /min  Temperature 97.1 F / 36.1 C  BMI 29.3 kg/m   MULTI-SYSTEM PHYSICAL EXAMINATION:    Constitutional: Well-nourished. No physical deformities. Normally developed. Good grooming.  Neck: Neck symmetrical, not swollen. Normal tracheal position.  Respiratory: Normal breath sounds. No labored breathing, no use of accessory muscles.   Cardiovascular: Regular rate and rhythm. No murmur, no gallop.   Lymphatic: No enlargement of neck, axillae, groin.  Skin: No paleness, no jaundice, no cyanosis. No lesion, no ulcer, no rash.  Neurologic / Psychiatric: Oriented to time, oriented to place, oriented to person. No depression, no anxiety, no agitation.  Gastrointestinal: No mass, no tenderness, no rigidity, non obese abdomen.  Eyes: Normal conjunctivae. Normal eyelids.  Ears, Nose, Mouth,  and Throat: Left ear no scars, no lesions, no masses. Right ear no scars, no lesions, no masses. Nose no scars, no lesions, no masses. Normal hearing. Normal lips.  Musculoskeletal: Normal gait and station of head and neck.     Complexity of Data:  Records Review:   Previous Patient Records  Urine Test Review:   Urinalysis   03/24/23  Urinalysis  Urine Appearance Clear   Urine Color Yellow   Urine Glucose Neg mg/dL  Urine Bilirubin Neg mg/dL  Urine Ketones Neg mg/dL  Urine Specific Gravity 1.025   Urine Blood Neg ery/uL  Urine pH <=5.0   Urine Protein Neg mg/dL  Urine Urobilinogen 0.2 mg/dL  Urine Nitrites Neg   Urine Leukocyte Esterase Neg leu/uL   PROCEDURES:          Urinalysis - 81003 Dipstick Dipstick Cont'd  Color: Yellow Bilirubin: Neg mg/dL  Appearance: Clear Ketones: Neg mg/dL  Specific Gravity: 1.191 Blood: Neg ery/uL  pH: <=5.0 Protein: Neg mg/dL  Glucose: Neg mg/dL Urobilinogen: 0.2 mg/dL    Nitrites: Neg    Leukocyte  Esterase: Neg leu/uL    ASSESSMENT:      ICD-10 Details  1 GU:   Prostate Cancer - C61    PLAN:            Medications Stop Meds: Ibuprofen  Discontinue: 03/24/2023  - Reason: The medication cycle was completed.  Multivitamin  Discontinue: 03/24/2023  - Reason: The medication cycle was completed.            Schedule Return Visit/Planned Activity: Keep Scheduled Appointment - Schedule Surgery          Document Letter(s):  Created for Patient: Clinical Summary         Notes:   There are no changes in the patients history or physical exam since last evaluation by Dr. Laverle Patter. Pt is scheduled to undergo brachytherapy and space oar on 04/02/23.   All pt's questions were answered to the best of my ability.          Next Appointment:      Next Appointment: 04/02/2023 01:00 PM    Appointment Type: Surgery     Location: [facility] [phone]    Provider: Heloise Purpura, M.D.    Reason for Visit: NE/OP BRACHYTHERAPY  AND SPACE OAR-$100.48- PAID      * Signed by Ulyses Amor, PA on 03/24/23 at 2:20 PM (EDT)*

## 2023-04-01 NOTE — Telephone Encounter (Signed)
CALLED PATIENT TO REMIND OF PROCEDURE FOR 04-02-23, SPOKE WITH PATIENT AND HE IS AWARE OF THIS PROCEDURE

## 2023-04-02 ENCOUNTER — Encounter (HOSPITAL_BASED_OUTPATIENT_CLINIC_OR_DEPARTMENT_OTHER): Payer: Self-pay | Admitting: Urology

## 2023-04-02 ENCOUNTER — Ambulatory Visit (HOSPITAL_BASED_OUTPATIENT_CLINIC_OR_DEPARTMENT_OTHER): Payer: BC Managed Care – PPO | Admitting: Anesthesiology

## 2023-04-02 ENCOUNTER — Other Ambulatory Visit: Payer: Self-pay

## 2023-04-02 ENCOUNTER — Ambulatory Visit (HOSPITAL_COMMUNITY): Payer: BC Managed Care – PPO

## 2023-04-02 ENCOUNTER — Ambulatory Visit (HOSPITAL_BASED_OUTPATIENT_CLINIC_OR_DEPARTMENT_OTHER)
Admission: RE | Admit: 2023-04-02 | Discharge: 2023-04-02 | Disposition: A | Discharge status: Home / Self Care | Payer: BC Managed Care – PPO | Source: Ambulatory Visit | Attending: Urology | Admitting: Urology

## 2023-04-02 ENCOUNTER — Encounter (HOSPITAL_BASED_OUTPATIENT_CLINIC_OR_DEPARTMENT_OTHER)
Admission: RE | Disposition: A | Discharge status: Home / Self Care | Payer: Self-pay | Source: Ambulatory Visit | Attending: Urology

## 2023-04-02 DIAGNOSIS — Z01818 Encounter for other preprocedural examination: Secondary | ICD-10-CM

## 2023-04-02 DIAGNOSIS — Z8042 Family history of malignant neoplasm of prostate: Secondary | ICD-10-CM | POA: Diagnosis not present

## 2023-04-02 DIAGNOSIS — Z808 Family history of malignant neoplasm of other organs or systems: Secondary | ICD-10-CM | POA: Diagnosis not present

## 2023-04-02 DIAGNOSIS — Z800 Family history of malignant neoplasm of digestive organs: Secondary | ICD-10-CM | POA: Diagnosis not present

## 2023-04-02 DIAGNOSIS — Z801 Family history of malignant neoplasm of trachea, bronchus and lung: Secondary | ICD-10-CM | POA: Insufficient documentation

## 2023-04-02 DIAGNOSIS — Z85828 Personal history of other malignant neoplasm of skin: Secondary | ICD-10-CM | POA: Diagnosis not present

## 2023-04-02 DIAGNOSIS — C61 Malignant neoplasm of prostate: Secondary | ICD-10-CM | POA: Diagnosis present

## 2023-04-02 HISTORY — DX: Generalized anxiety disorder: F41.1

## 2023-04-02 HISTORY — PX: RADIOACTIVE SEED IMPLANT: SHX5150

## 2023-04-02 HISTORY — DX: Presence of spectacles and contact lenses: Z97.3

## 2023-04-02 HISTORY — PX: SPACE OAR INSTILLATION: SHX6769

## 2023-04-02 HISTORY — DX: Other intervertebral disc degeneration, lumbosacral region: M51.37

## 2023-04-02 HISTORY — DX: Chronic gout, unspecified, without tophus (tophi): M1A.9XX0

## 2023-04-02 HISTORY — DX: Nocturia: R35.1

## 2023-04-02 HISTORY — DX: Other intervertebral disc degeneration, lumbosacral region without mention of lumbar back pain or lower extremity pain: M51.379

## 2023-04-02 LAB — POCT I-STAT, CHEM 8
BUN: 17 mg/dL (ref 8–23)
Calcium, Ion: 1.22 mmol/L (ref 1.15–1.40)
Chloride: 107 mmol/L (ref 98–111)
Creatinine, Ser: 1 mg/dL (ref 0.61–1.24)
Glucose, Bld: 105 mg/dL — ABNORMAL HIGH (ref 70–99)
HCT: 43 % (ref 39.0–52.0)
Hemoglobin: 14.6 g/dL (ref 13.0–17.0)
Potassium: 3.7 mmol/L (ref 3.5–5.1)
Sodium: 138 mmol/L (ref 135–145)
TCO2: 19 mmol/L — ABNORMAL LOW (ref 22–32)

## 2023-04-02 SURGERY — INSERTION, RADIATION SOURCE, PROSTATE
Anesthesia: General | Site: Prostate

## 2023-04-02 MED ORDER — AMISULPRIDE (ANTIEMETIC) 5 MG/2ML IV SOLN: 10.0000 mg | Freq: Once | INTRAVENOUS | Status: DC | PRN

## 2023-04-02 MED ORDER — ACETAMINOPHEN 500 MG PO TABS
ORAL_TABLET | ORAL | Status: AC
  Filled 2023-04-02: qty 2

## 2023-04-02 MED ORDER — TAMSULOSIN HCL 0.4 MG PO CAPS: 0.4000 mg | ORAL_CAPSULE | Freq: Every day | ORAL | 0 refills | Status: AC

## 2023-04-02 MED ORDER — CIPROFLOXACIN IN D5W 400 MG/200ML IV SOLN
400.0000 mg | INTRAVENOUS | Status: AC
  Administered 2023-04-02: 400 mg via INTRAVENOUS

## 2023-04-02 MED ORDER — LIDOCAINE 2% (20 MG/ML) 5 ML SYRINGE
INTRAMUSCULAR | Status: DC | PRN
  Administered 2023-04-02: 80 mg via INTRAVENOUS

## 2023-04-02 MED ORDER — MIDAZOLAM HCL 2 MG/2ML IJ SOLN
INTRAMUSCULAR | Status: AC
  Filled 2023-04-02: qty 2

## 2023-04-02 MED ORDER — ACETAMINOPHEN 500 MG PO TABS
1000.0000 mg | ORAL_TABLET | Freq: Once | ORAL | Status: AC
  Administered 2023-04-02: 1000 mg via ORAL

## 2023-04-02 MED ORDER — LIDOCAINE HCL (PF) 2 % IJ SOLN
INTRAMUSCULAR | Status: AC
  Filled 2023-04-02: qty 5

## 2023-04-02 MED ORDER — SODIUM CHLORIDE (PF) 0.9 % IJ SOLN
INTRAMUSCULAR | Status: DC | PRN
  Administered 2023-04-02: 10 mL

## 2023-04-02 MED ORDER — OXYCODONE HCL 5 MG PO TABS: 5.0000 mg | ORAL_TABLET | Freq: Once | ORAL | Status: DC | PRN

## 2023-04-02 MED ORDER — ACETAMINOPHEN 10 MG/ML IV SOLN: 1000.0000 mg | Freq: Once | INTRAVENOUS | Status: DC | PRN

## 2023-04-02 MED ORDER — FLEET ENEMA 7-19 GM/118ML RE ENEM: 1.0000 | ENEMA | Freq: Once | RECTAL | Status: DC

## 2023-04-02 MED ORDER — PHENYLEPHRINE 80 MCG/ML (10ML) SYRINGE FOR IV PUSH (FOR BLOOD PRESSURE SUPPORT)
PREFILLED_SYRINGE | INTRAVENOUS | Status: AC
  Filled 2023-04-02: qty 10

## 2023-04-02 MED ORDER — EPHEDRINE SULFATE (PRESSORS) 50 MG/ML IJ SOLN
INTRAMUSCULAR | Status: DC | PRN
  Administered 2023-04-02 (×2): 5 mg via INTRAVENOUS
  Administered 2023-04-02 (×2): 10 mg via INTRAVENOUS

## 2023-04-02 MED ORDER — EPHEDRINE 5 MG/ML INJ
INTRAVENOUS | Status: AC
  Filled 2023-04-02: qty 10

## 2023-04-02 MED ORDER — STERILE WATER FOR IRRIGATION IR SOLN
Status: DC | PRN
  Administered 2023-04-02: 3000 mL

## 2023-04-02 MED ORDER — ONDANSETRON HCL 4 MG/2ML IJ SOLN
INTRAMUSCULAR | Status: AC
  Filled 2023-04-02: qty 2

## 2023-04-02 MED ORDER — PHENYLEPHRINE 80 MCG/ML (10ML) SYRINGE FOR IV PUSH (FOR BLOOD PRESSURE SUPPORT)
PREFILLED_SYRINGE | INTRAVENOUS | Status: DC | PRN
  Administered 2023-04-02: 80 ug via INTRAVENOUS
  Administered 2023-04-02: 160 ug via INTRAVENOUS
  Administered 2023-04-02 (×3): 80 ug via INTRAVENOUS
  Administered 2023-04-02: 160 ug via INTRAVENOUS

## 2023-04-02 MED ORDER — DEXAMETHASONE SODIUM PHOSPHATE 10 MG/ML IJ SOLN
INTRAMUSCULAR | Status: AC
  Filled 2023-04-02: qty 1

## 2023-04-02 MED ORDER — LACTATED RINGERS IV SOLN
INTRAVENOUS | Status: DC
  Administered 2023-04-02: 1000 mL via INTRAVENOUS

## 2023-04-02 MED ORDER — ACETAMINOPHEN 160 MG/5ML PO SOLN: 325.0000 mg | ORAL | Status: DC | PRN

## 2023-04-02 MED ORDER — SUGAMMADEX SODIUM 200 MG/2ML IV SOLN
INTRAVENOUS | Status: DC | PRN
  Administered 2023-04-02: 200 mg via INTRAVENOUS

## 2023-04-02 MED ORDER — ONDANSETRON HCL 4 MG/2ML IJ SOLN
INTRAMUSCULAR | Status: DC | PRN
  Administered 2023-04-02: 4 mg via INTRAVENOUS

## 2023-04-02 MED ORDER — TRAMADOL HCL 50 MG PO TABS: 50.0000 mg | ORAL_TABLET | Freq: Four times a day (QID) | ORAL | 0 refills | Status: DC | PRN

## 2023-04-02 MED ORDER — OXYCODONE HCL 5 MG/5ML PO SOLN: 5.0000 mg | Freq: Once | ORAL | Status: DC | PRN

## 2023-04-02 MED ORDER — IOHEXOL 300 MG/ML  SOLN
INTRAMUSCULAR | Status: DC | PRN
  Administered 2023-04-02: 7 mL via URETHRAL

## 2023-04-02 MED ORDER — MIDAZOLAM HCL 5 MG/5ML IJ SOLN
INTRAMUSCULAR | Status: DC | PRN
  Administered 2023-04-02: 2 mg via INTRAVENOUS

## 2023-04-02 MED ORDER — SODIUM CHLORIDE 0.9 % IR SOLN
Status: DC | PRN
  Administered 2023-04-02: 1000 mL

## 2023-04-02 MED ORDER — ROCURONIUM BROMIDE 10 MG/ML (PF) SYRINGE
PREFILLED_SYRINGE | INTRAVENOUS | Status: DC | PRN
  Administered 2023-04-02: 20 mg via INTRAVENOUS
  Administered 2023-04-02: 80 mg via INTRAVENOUS

## 2023-04-02 MED ORDER — ACETAMINOPHEN 325 MG PO TABS: 325.0000 mg | ORAL_TABLET | ORAL | Status: DC | PRN

## 2023-04-02 MED ORDER — FENTANYL CITRATE (PF) 100 MCG/2ML IJ SOLN: 25.0000 ug | INTRAMUSCULAR | Status: DC | PRN

## 2023-04-02 MED ORDER — FENTANYL CITRATE (PF) 100 MCG/2ML IJ SOLN
INTRAMUSCULAR | Status: AC
  Filled 2023-04-02: qty 2

## 2023-04-02 MED ORDER — CIPROFLOXACIN IN D5W 400 MG/200ML IV SOLN
INTRAVENOUS | Status: AC
  Filled 2023-04-02: qty 200

## 2023-04-02 MED ORDER — DEXAMETHASONE SODIUM PHOSPHATE 10 MG/ML IJ SOLN
INTRAMUSCULAR | Status: DC | PRN
  Administered 2023-04-02: 10 mg via INTRAVENOUS

## 2023-04-02 MED ORDER — PROMETHAZINE HCL 25 MG/ML IJ SOLN: 6.2500 mg | INTRAMUSCULAR | Status: DC | PRN

## 2023-04-02 MED ORDER — FENTANYL CITRATE (PF) 100 MCG/2ML IJ SOLN
INTRAMUSCULAR | Status: DC | PRN
  Administered 2023-04-02 (×2): 25 ug via INTRAVENOUS
  Administered 2023-04-02: 50 ug via INTRAVENOUS
  Administered 2023-04-02: 25 ug via INTRAVENOUS

## 2023-04-02 MED ORDER — PROPOFOL 10 MG/ML IV BOLUS
INTRAVENOUS | Status: AC
  Filled 2023-04-02: qty 20

## 2023-04-02 MED ORDER — ROCURONIUM BROMIDE 10 MG/ML (PF) SYRINGE
PREFILLED_SYRINGE | INTRAVENOUS | Status: AC
  Filled 2023-04-02: qty 10

## 2023-04-02 MED ORDER — DEXMEDETOMIDINE HCL IN NACL 80 MCG/20ML IV SOLN
INTRAVENOUS | Status: DC | PRN
  Administered 2023-04-02 (×3): 4 ug via INTRAVENOUS

## 2023-04-02 MED ORDER — PROPOFOL 10 MG/ML IV BOLUS
INTRAVENOUS | Status: DC | PRN
  Administered 2023-04-02: 180 mg via INTRAVENOUS

## 2023-04-02 SURGICAL SUPPLY — 47 items
BAG DRN RND TRDRP ANRFLXCHMBR (UROLOGICAL SUPPLIES) ×1
BAG URINE DRAIN 2000ML AR STRL (UROLOGICAL SUPPLIES) ×1 IMPLANT
BLADE CLIPPER SENSICLIP SURGIC (BLADE) ×1 IMPLANT
CATH FOLEY 2WAY SLVR 5CC 16FR (CATHETERS) ×1 IMPLANT
CATH ROBINSON RED A/P 16FR (CATHETERS) IMPLANT
CATH ROBINSON RED A/P 20FR (CATHETERS) ×1 IMPLANT
CLOTH BEACON ORANGE TIMEOUT ST (SAFETY) ×1 IMPLANT
CNTNR URN SCR LID CUP LEK RST (MISCELLANEOUS) ×1 IMPLANT
CONT SPEC 4OZ STRL OR WHT (MISCELLANEOUS)
COVER BACK TABLE 60X90IN (DRAPES) ×1 IMPLANT
COVER MAYO STAND STRL (DRAPES) ×1 IMPLANT
DRSG TEGADERM 4X4.75 (GAUZE/BANDAGES/DRESSINGS) ×1 IMPLANT
DRSG TEGADERM 8X12 (GAUZE/BANDAGES/DRESSINGS) ×1 IMPLANT
GAUZE SPONGE 4X4 12PLY STRL (GAUZE/BANDAGES/DRESSINGS) IMPLANT
GEL ULTRASOUND 20GR AQUASONIC (MISCELLANEOUS) ×2 IMPLANT
GLOVE BIO SURGEON STRL SZ 6.5 (GLOVE) IMPLANT
GLOVE BIO SURGEON STRL SZ7.5 (GLOVE) ×2 IMPLANT
GLOVE BIO SURGEON STRL SZ8 (GLOVE) IMPLANT
GLOVE BIOGEL PI IND STRL 6.5 (GLOVE) IMPLANT
GLOVE SURG ORTHO 8.5 STRL (GLOVE) ×1 IMPLANT
GLOVE SURG SS PI 6.5 STRL IVOR (GLOVE) IMPLANT
GOWN STRL REUS W/ TWL LRG LVL3 (GOWN DISPOSABLE) IMPLANT
GOWN STRL REUS W/TWL LRG LVL3 (GOWN DISPOSABLE) ×2 IMPLANT
GRID BRACH TEMP 18GA 2.8X3X.75 (MISCELLANEOUS) ×1 IMPLANT
HOLDER FOLEY CATH W/STRAP (MISCELLANEOUS) ×1 IMPLANT
I-125 seeds IMPLANT
IMPL SPACEOAR VUE SYSTEM (Spacer) ×1 IMPLANT
IMPLANT SPACEOAR VUE SYSTEM (Spacer) ×1 IMPLANT
IV NS 1000ML (IV SOLUTION) ×1
IV NS 1000ML BAXH (IV SOLUTION) ×1 IMPLANT
KIT TURNOVER CYSTO (KITS) ×1 IMPLANT
NDL BRACHY 18G 5PK (NEEDLE) ×4 IMPLANT
NDL BRACHY 18G SINGLE (NEEDLE) IMPLANT
NDL PK MORGANSTERN STABILIZ (NEEDLE) ×1 IMPLANT
NEEDLE BRACHY 18G 5PK (NEEDLE) ×4 IMPLANT
NEEDLE BRACHY 18G SINGLE (NEEDLE) IMPLANT
NEEDLE PK MORGANSTERN STABILIZ (NEEDLE) ×1 IMPLANT
PACK CYSTO (CUSTOM PROCEDURE TRAY) ×1 IMPLANT
SHEATH ULTRASOUND LF (SHEATH) IMPLANT
SHEATH ULTRASOUND LTX NONSTRL (SHEATH) IMPLANT
SLEEVE SCD COMPRESS KNEE MED (STOCKING) ×1 IMPLANT
SUT BONE WAX W31G (SUTURE) IMPLANT
SYR 10ML LL (SYRINGE) ×1 IMPLANT
TOWEL OR 17X24 6PK STRL BLUE (TOWEL DISPOSABLE) ×1 IMPLANT
UNDERPAD 30X36 HEAVY ABSORB (UNDERPADS AND DIAPERS) ×2 IMPLANT
WATER STERILE IRR 3000ML UROMA (IV SOLUTION) IMPLANT
WATER STERILE IRR 500ML POUR (IV SOLUTION) ×1 IMPLANT

## 2023-04-02 NOTE — Op Note (Signed)
Preoperative diagnosis: Clinically localized adenocarcinoma of the prostate (cT1c N0 M0)  Postoperative diagnosis: Clinically localized adenocarcinoma of the prostate  Procedure: 1) Transperineal placement of radioactive seeds into the prostate                    2) Cystoscopy                    3) Insertion of SpaceOAR hydrogel   Surgeon: Moody Bruins. M.D.  Radiation oncologist: Margaretmary Dys, M.D.  Anesthesia: General  EBL: Minimal  Complications: None  Indication: Marcus Kramer is a 62 y.o. gentleman with clinically localized prostate cancer. After discussing management options for treatment, he elected to proceed with radiotherapy. He presents today for the above procedures. The potential risks, complications, alternative options, and expected recovery course have been discussed in detail with the patient and he has provided informed consent to proceed.  Description of procedure: The patient was taken to the operating room and general anesthesia was induced. He was administered preoperative antibiotics, placed in the dorsal lithotomy position, and prepped and draped in the usual sterile fashion. Next, intraoperative transrectal ultrasonography was utilized for real-time intraoperative planning by the radiation oncology team. Once the treatment plan was completed and the seed strands created, stranded iodine 125 radiation seeds were placed utilizing a brachytherapy perineal template. 62 radioactive iodine 125 seeds into the prostate through 16 catheter needles.  The brachytherapy template was then removed.  A site in the midline was selected on the perineum for placement of an 18 g needle with saline.  The needle was advanced above the rectum and below Denonvillier's fascia to the mid gland and confirmed to be in the midline on transverse imaging.  One cc of saline was injected confirming appropriate expansion of this space.  A total of 5 cc of saline was then injected to open the  space further bilaterally.  The saline syringe was then removed and the SpaceOAR hydrogel was injected with good distribution bilaterally. Position of the radiation seeds was confirmed on fluoroscopic imaging.  Flexible cystoscopy was then performed and no seeds were identified within the bladder.  No bladder tumors, stones, or other mucosal pathology was identified within the bladder. He tolerated the procedure well and without complications. He was able to be transferred to the recovery unit in satisfactory condition.  He was given a voiding trial in the PACU.

## 2023-04-02 NOTE — Anesthesia Preprocedure Evaluation (Addendum)
Anesthesia Evaluation  Patient identified by MRN, date of birth, ID band Patient awake    Reviewed: Allergy & Precautions, NPO status , Patient's Chart, lab work & pertinent test results  Airway Mallampati: I  TM Distance: >3 FB Neck ROM: Full    Dental  (+) Teeth Intact, Dental Advisory Given   Pulmonary neg pulmonary ROS   breath sounds clear to auscultation       Cardiovascular hypertension,  Rhythm:Regular Rate:Normal     Neuro/Psych  PSYCHIATRIC DISORDERS Anxiety     negative neurological ROS     GI/Hepatic Neg liver ROS,GERD  ,,  Endo/Other  negative endocrine ROS    Renal/GU negative Renal ROS     Musculoskeletal  (+) Arthritis ,    Abdominal   Peds  Hematology negative hematology ROS (+)   Anesthesia Other Findings   Reproductive/Obstetrics                             Anesthesia Physical Anesthesia Plan  ASA: 2  Anesthesia Plan: General   Post-op Pain Management: Tylenol PO (pre-op)* and Toradol IV (intra-op)*   Induction: Intravenous  PONV Risk Score and Plan: 3 and Ondansetron, Dexamethasone and Midazolam  Airway Management Planned: Oral ETT  Additional Equipment: None  Intra-op Plan:   Post-operative Plan: Extubation in OR  Informed Consent: I have reviewed the patients History and Physical, chart, labs and discussed the procedure including the risks, benefits and alternatives for the proposed anesthesia with the patient or authorized representative who has indicated his/her understanding and acceptance.     Dental advisory given  Plan Discussed with: CRNA  Anesthesia Plan Comments:        Anesthesia Quick Evaluation

## 2023-04-02 NOTE — Transfer of Care (Signed)
Immediate Anesthesia Transfer of Care Note  Patient: Marcus Kramer  Procedure(s) Performed: RADIOACTIVE SEED IMPLANT/BRACHYTHERAPY IMPLANT (Prostate) SPACE OAR INSTILLATION (Prostate)  Patient Location: PACU  Anesthesia Type:General  Level of Consciousness: awake, alert , and oriented  Airway & Oxygen Therapy: Patient Spontanous Breathing and Patient connected to face mask oxygen  Post-op Assessment: Report given to RN and Post -op Vital signs reviewed and stable  Post vital signs: Reviewed and stable  Last Vitals:  Vitals Value Taken Time  BP 118/70 04/02/23 1415  Temp 36.6 C 04/02/23 1415  Pulse 95 04/02/23 1416  Resp 15 04/02/23 1416  SpO2 98 % 04/02/23 1416  Vitals shown include unvalidated device data.  Last Pain:  Vitals:   04/02/23 1112  TempSrc:   PainSc: 0-No pain      Patients Stated Pain Goal: 8 (04/02/23 1112)  Complications: No notable events documented.

## 2023-04-02 NOTE — Anesthesia Procedure Notes (Signed)
Procedure Name: Intubation Date/Time: 04/02/2023 12:52 PM  Performed by: Norelle Runnion D, CRNAPre-anesthesia Checklist: Patient identified, Emergency Drugs available, Suction available and Patient being monitored Patient Re-evaluated:Patient Re-evaluated prior to induction Oxygen Delivery Method: Circle system utilized Preoxygenation: Pre-oxygenation with 100% oxygen Induction Type: IV induction Ventilation: Mask ventilation without difficulty Laryngoscope Size: Mac and 4 Grade View: Grade I Tube type: Oral Tube size: 7.0 mm Number of attempts: 1 Airway Equipment and Method: Stylet and Oral airway Placement Confirmation: ETT inserted through vocal cords under direct vision, positive ETCO2 and breath sounds checked- equal and bilateral Secured at: 22 cm Tube secured with: Tape Dental Injury: Teeth and Oropharynx as per pre-operative assessment

## 2023-04-02 NOTE — Progress Notes (Addendum)
Radiation Oncology         [phone] ________________________________  Name: RUPERTO MAUNU MRN: [id number]  Date: 04/02/2023  DOB: [birth year]       Prostate Seed Implant  NW:[id number], Hardin Negus, MD  No ref. provider found  DIAGNOSIS:   62 y.o. 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  Oncology History  Malignant neoplasm of prostate (HCC)  11/06/2022 Cancer Staging   Staging form: Prostate, AJCC 8th Edition - Clinical stage from 11/06/2022: Stage IIC (cT1c, cN0, cM0, PSA: 2.5, Grade Group: 3) - Signed by Marcello Fennel, PA-C on 12/17/2022 Histopathologic type: Adenocarcinoma, NOS Stage prefix: Initial diagnosis Prostate specific antigen (PSA) range: Less than 10 Gleason primary pattern: 4 Gleason secondary pattern: 3 Gleason score: 7 Histologic grading system: 5 grade system Number of biopsy cores examined: 16 Number of biopsy cores positive: 10 Location of positive needle core biopsies: Both sides   12/17/2022 Initial Diagnosis   Malignant neoplasm of prostate (HCC)   02/13/2023 Genetic Testing   Negative genetic testing on the Multi-cancer + RNA gene panel.  Specifically, the MSH2 Exon 1-6 del familial mutation was not identified.  The report date is Feb 13, 2023.  The Multi-Cancer + RNA Panel offered by [facility] includes sequencing and/or deletion/duplication analysis of the following 70 genes:  AIP*, ALK, APC*, ATM*, AXIN2*, BAP1*, BARD1*, BLM*, BMPR1A*, BRCA1*, BRCA2*, BRIP1*, CDC73*, CDH1*, CDK4, CDKN1B*, CDKN2A, CHEK2*, CTNNA1*, DICER1*, EPCAM (del/dup only), EGFR, FH*, FLCN*, GREM1 (promoter dup only), HOXB13, KIT, LZTR1, MAX*, MBD4, MEN1*, MET, MITF, MLH1*, MSH2*, MSH3*, MSH6*, MUTYH*, NF1*, NF2*, NTHL1*, PALB2*, PDGFRA, PMS2*, POLD1*, POLE*, POT1*, PRKAR1A*, PTCH1*, PTEN*, RAD51C*, RAD51D*, RB1*, RET, SDHA* (sequencing only), SDHAF2*, SDHB*, SDHC*, SDHD*, SMAD4*, SMARCA4*, SMARCB1*,  SMARCE1*, STK11*, SUFU*, TMEM127*, TP53*, TSC1*, TSC2*, VHL*. RNA analysis is performed for * genes.        ICD-10-CM   1. Pre-op testing  Z01.818 CBG per Guidelines for Diabetes Management for Patients Undergoing Surgery ([facility], [facility], and [facility] only)    CBG per protocol    I-Stat, Chem 8 on day of surgery per protocol    CBG per Guidelines for Diabetes Management for Patients Undergoing Surgery ([facility], [facility], and [facility] only)    CBG per protocol    I-Stat, Chem 8 on day of surgery per protocol    CANCELED: EKG 12 lead per protocol      PROCEDURE: Insertion of radioactive I-125 seeds into the prostate gland.  RADIATION DOSE: 110 Gy, boost therapy.  TECHNIQUE: BOEN MALATESTA was brought to the operating room with the urologist. He was placed in the dorsolithotomy position. He was catheterized and a rectal tube was inserted. The perineum was shaved, prepped and draped. The ultrasound probe was then introduced by me into the rectum to see the prostate gland.  TREATMENT DEVICE: I attached the needle grid to the ultrasound probe stand and anchor needles were placed.  3D PLANNING: The prostate was imaged in 3D using a sagittal sweep of the prostate probe. These images were transferred to the planning computer. There, the prostate, urethra and rectum were defined on each axial reconstructed image. Then, the software created an optimized 3D plan and a few seed positions were adjusted. The quality of the plan was reviewed using [facility] information for the target and the following two organs at risk:  Urethra and Rectum.  Then the accepted plan was printed and handed off to the radiation therapist.  Under my supervision, the custom loading of the seeds and spacers was carried out using the quick loader.  These pre-loaded needles were then placed into the needle holder.Marland Kitchen  PROSTATE VOLUME STUDY:  Using transrectal ultrasound the volume of the prostate was verified to be 19.4 cc.  SPECIAL TREATMENT PROCEDURE/SUPERVISION  AND HANDLING: The pre-loaded needles were then delivered by the urologist under sagittal guidance. A total of 16 needles were used to deposit 62 seeds in the prostate gland. The individual seed activity was 0.207 mCi.  SpaceOAR:  Yes  COMPLEX SIMULATION: At the end of the procedure, an anterior radiograph of the pelvis was obtained to document seed positioning and count. Cystoscopy was performed by the urologist to check the urethra and bladder.  There was a seed visualized in the urethra at the bladder neck.  This seed was removed by Dr. Laverle Patter.  It was identified as the deepest see from the needle in H3.  This seed was removed from the plan in the software and the dose was recalculated.  Coverage of the prostate remained excellent, such, that no additional seeds or changes were required.  The volume of prostate receiving the prescription dose or greater was 96.57%, which exceeds our goal of 95% coverage.  MICRODOSIMETRY: At the end of the procedure, the patient was emitting 0.05 mR/hr at 1 meter. Accordingly, he was considered safe for hospital discharge.  PLAN: The patient will return to the radiation oncology clinic for post implant CT dosimetry in three weeks.   ________________________________  Artist Pais Kathrynn Running, M.D.

## 2023-04-02 NOTE — Discharge Instructions (Addendum)
You will be prescribed tamsulosin which is a medication to help you urinate over the next month.  You should call Dr. Vevelyn Royals office [phone]) if you feel you cannot empty your bladder well. Also, call if you develop fever > 101.  You will also be prescribed pain medication and should take an over the counter stool softener over the next week to avoid straining with bowel movements.  Followup with Dr. Laverle Patter and your radiation oncologist as scheduled.    Post Anesthesia Home Care Instructions  Activity: Get plenty of rest for the remainder of the day. A responsible individual must stay with you for 24 hours following the procedure.  For the next 24 hours, DO NOT: -Drive a car -Advertising copywriter -Drink alcoholic beverages -Take any medication unless instructed by your physician -Make any legal decisions or sign important papers.  Meals: Start with liquid foods such as gelatin or soup. Progress to regular foods as tolerated. Avoid greasy, spicy, heavy foods. If nausea and/or vomiting occur, drink only clear liquids until the nausea and/or vomiting subsides. Call your physician if vomiting continues.  Special Instructions/Symptoms: Your throat may feel dry or sore from the anesthesia or the breathing tube placed in your throat during surgery. If this causes discomfort, gargle with warm salt water. The discomfort should disappear within 24 hours.  PROSTATE CANCER TREATMENT WITH RADIOACTIVE IODINE-125 SEED IMPLANT  This instruction sheet is intended to discuss implantation of Iodine-125 seeds as treatment for cancer of the prostate. It will explain in detail what you may expect from this treatment and what precautions are necessary as a result of the treatment. Iodine-125 emits a relatively low energy radiation. The radioactive seeds are surgically implanted directly into the prostate gland. Most of the radiation is contained within the prostate gland. A very small amount is present outside  the body.The precautions that we ask you to take are to ensure that those around you are protected from unnecessary radiation. The principles of radiation safety that you need to understand are:  DISTANCE: The further a person is from the radioactive implant the less radiation they will be receiving. The amount of radiation received falls off quite rapidly with distance. More specific guidelines are given in the table on the last page.  TIME: The amount of radiation a person is exposed to is directly proportional to the amount of time that is spent in close proximity to the radioactive implant. Very little radiation will be received during short periods. See the table on the last page for more specific guideline.  CHILDREN UNDER AGE 29 Children should not be allowed to sit on your lap or otherwise be in very close contact for more than a few minutes for the first 6-8 weeks following the implant. You may affectionately greet (hug/kiss) a child for a short period of time, but remember, the longer you are in close proximity with that child the more radiation they are being exposed to. At a distance of 6 feet there is no limit to the length of time you may spend together. See specific guidelines on the last page.  PREGNANT OR POSSIBLY PREGNANT WOMEN Pregnant women should avoid prolonged close physical contact with you for the first 6-8 weeks after implant. At a distance of 6 feet there is no limit to the length of time you may spend together. Pregnant women or possibly pregnant women can safely be in close contact with you for a limited period of time. See the last page for guidelines.  FAMILY RELATIONS You may sleep in the same bed as your partner (provided she is not pregnant or under the age of 39). Sexual intercourse, using a condom, may be resumed 2 weeks after the implant. Your semen may be discolored, dark brown or black. This is normal and is the result of bleeding that may have occurred during the  implant. After 3-4 weeks it will not be necessary to use a condom.  DAILY ACTIVITIES You may resume normal activities in a few days (example: work, shopping, church) without the risk of harmful radiation exposure to those around you provided you keep in mind the time and distance precautions. Objects that you touch or item that you use do not become radioactive. Linens, clothing, tableware, and dishes may be used by other persons without special precautions. Your bodily wastes (urine and stool) are not radioactive.  SPECIAL PRECAUTIONS It is possible to lose implanted Iodine-125 seed(s) through urination. Although it is possible to pass seeds indefinitely, it is most likely to occur immediately after catheter removal. To prevent this from happening the catheter that was in place during the implant procedure is removed immediately after the implant and a cystoscopy procedure is performed. The process of removing the catheter and the cystoscopy procedure should dislodge and remove any seeds that are not firmly imbedded in the prostate tissue. However, you should watch for seeds if/when you remove your catheter at home. The seeds are silver colored and the size of a grain of rice. In the unlikely event that a seed is seen after urination, simply flush the seed down the toilet. The seed should not be handled with your fingers, not even with a glove or napkin. A spoon or tweezers can be used to pick up a seed. The Radiation Oncology department is open Monday - Friday from 8:00 am to 5:30 pm with a Radiation Oncologist on call at all times. He or she may be reached by calling [phone]. If you are to be hospitalized or if death should occur, your family should notify the Actor.  SIDE EFFECTS There are very few side effects associate with the implant procedure. Minor burning with urination, weak stream, hesitancy, intermittency, frequency, mild pain or feeling unable to pass your urine  freely are common and usually stop in one to four months. If these symptoms are extremely uncomfortable, contact your physician.  RADIATION SAFETY GUIDELINES PROSTATE CANCER TREATMENT WITH RADIOACTIVE IODINE-125 SEED IMPLANT  The following guidelines will limit exposure to less than naturally occurring background radiation.  PERSONS AGE 30-45 (if able to become pregnant)  FOR 8 WEEKS FOLLOWING IMPLANT  At a distance of 1 foot: limit time to less than 2 hours/week At a distance of 3 feet: limit time to 20 hours/week At a distance of 6 feet: no restrictions  AFTER 8 WEEKS No restrictions  CHILDREN UNDER AGE 30, PREGNANT WOMEN OR POSSIBLY PREGNANT WOMEN  FOR 8 WEEKS FOLLOWING IMPLANT At a distance of 1 foot: limit time to 10 minutes/week At a distance of 3 feet: limit time to 2 hours/week At a distance of 6 feet: no restrictions  AFTER 8 WEEKS No restrictions  PERSONS OVER THE AGE OF 45 AND DO NOT EXPECT TO HAVE ANY MORE CHILDREN No restrictions  Updated by SCP in January 2020     May take Tylenol beginning at 6 PM as needed for soreness/discomfort.

## 2023-04-02 NOTE — Interval H&P Note (Signed)
History and Physical Interval Note:  04/02/2023 11:41 AM  Marcus Kramer  has presented today for surgery, with the diagnosis of PROSTATE CANCER.  The various methods of treatment have been discussed with the patient and family. After consideration of risks, benefits and other options for treatment, the patient has consented to  Procedure(s) with comments: RADIOACTIVE SEED IMPLANT/BRACHYTHERAPY IMPLANT (N/A) - 90 MINUTES NEEDED FOR CASE SPACE OAR INSTILLATION (N/A) as a surgical intervention.  The patient's history has been reviewed, patient examined, no change in status, stable for surgery.  I have reviewed the patient's chart and labs.  Questions were answered to the patient's satisfaction.     [employer]

## 2023-04-03 ENCOUNTER — Encounter (HOSPITAL_BASED_OUTPATIENT_CLINIC_OR_DEPARTMENT_OTHER): Payer: Self-pay | Admitting: Urology

## 2023-04-03 ENCOUNTER — Other Ambulatory Visit: Payer: Self-pay | Admitting: Internal Medicine

## 2023-04-03 DIAGNOSIS — E782 Mixed hyperlipidemia: Secondary | ICD-10-CM

## 2023-04-03 DIAGNOSIS — Z0000 Encounter for general adult medical examination without abnormal findings: Secondary | ICD-10-CM

## 2023-04-03 NOTE — Anesthesia Postprocedure Evaluation (Signed)
Anesthesia Post Note  Patient: Marcus Kramer  Procedure(s) Performed: RADIOACTIVE SEED IMPLANT/BRACHYTHERAPY IMPLANT (Prostate) SPACE OAR INSTILLATION (Prostate)     Patient location during evaluation: PACU Anesthesia Type: General Level of consciousness: awake and alert Pain management: pain level controlled Vital Signs Assessment: post-procedure vital signs reviewed and stable Respiratory status: spontaneous breathing, nonlabored ventilation, respiratory function stable and patient connected to nasal cannula oxygen Cardiovascular status: blood pressure returned to baseline and stable Postop Assessment: no apparent nausea or vomiting Anesthetic complications: no   No notable events documented.  Last Vitals:  Vitals:   04/02/23 1445 04/02/23 1544  BP: 130/73 124/78  Pulse: 95 79  Resp: 12 17  Temp:  (!) 36.4 C  SpO2: 92% 95%    Last Pain:  Vitals:   04/02/23 1544  TempSrc: Oral  PainSc: 1                  Shelton Silvas

## 2023-04-08 ENCOUNTER — Ambulatory Visit
Admission: RE | Admit: 2023-04-08 | Discharge: 2023-04-08 | Disposition: A | Discharge status: Home / Self Care | Payer: Self-pay | Source: Ambulatory Visit | Attending: Internal Medicine | Admitting: Internal Medicine

## 2023-04-08 DIAGNOSIS — E782 Mixed hyperlipidemia: Secondary | ICD-10-CM | POA: Insufficient documentation

## 2023-04-08 DIAGNOSIS — Z0000 Encounter for general adult medical examination without abnormal findings: Secondary | ICD-10-CM | POA: Insufficient documentation

## 2023-04-10 ENCOUNTER — Telehealth: Payer: Self-pay | Admitting: *Deleted

## 2023-04-10 NOTE — Telephone Encounter (Signed)
CALLED PATIENT TO REMIND OF POST SEED/SIM APPT. FOR 04-13-23- ARRIVAL TIME- 10:45  AM , NO ANSWER

## 2023-04-10 NOTE — Telephone Encounter (Signed)
Called patient to remind of post seed/sim appt. for 04-13-23- arrival time- 10:45 am, spoke with patient and he is aware of this appt.

## 2023-04-13 ENCOUNTER — Encounter: Payer: Self-pay | Admitting: Radiation Oncology

## 2023-04-13 ENCOUNTER — Ambulatory Visit
Admission: RE | Admit: 2023-04-13 | Discharge: 2023-04-13 | Disposition: A | Discharge status: Home / Self Care | Payer: BC Managed Care – PPO | Source: Ambulatory Visit | Attending: Radiation Oncology | Admitting: Radiation Oncology

## 2023-04-13 DIAGNOSIS — C61 Malignant neoplasm of prostate: Secondary | ICD-10-CM | POA: Diagnosis present

## 2023-04-13 DIAGNOSIS — Z510 Encounter for antineoplastic radiation therapy: Secondary | ICD-10-CM | POA: Insufficient documentation

## 2023-04-13 NOTE — Progress Notes (Signed)
  Radiation Oncology         [phone] ________________________________  Name: [name] MRN: [id number]  Date: 04/13/2023  DOB: 16-Jul-1961  3D Planning Note   Prostate Brachytherapy Post-Implant Dosimetry  Diagnosis: 62 y.o. 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   Narrative: On a previous date, Marcus Kramer returned following prostate seed implantation for post implant planning. He underwent CT scan complex simulation to delineate the three-dimensional structures of the pelvis and demonstrate the radiation distribution.  Since that time, the seed localization, and complex isodose planning with dose volume histograms have now been completed.  Results:   Prostate Coverage - The dose of radiation delivered to the 90% or more of the prostate gland (D90) was 97.91% of the prescription dose. This exceeds our goal of greater than 90%. Rectal Sparing - The volume of rectal tissue receiving the prescription dose or higher was 0.0 cc. This falls under our thresholds tolerance of 1.0 cc.  Impression: The prostate seed implant appears to show adequate target coverage and appropriate rectal sparing.  Plan:  The patient will continue to follow with urology for ongoing PSA determinations. I would anticipate a high likelihood for local tumor control with minimal risk for rectal morbidity.  ________________________________  Artist Pais Kathrynn Running, M.D.

## 2023-04-13 NOTE — Progress Notes (Signed)
  Radiation Oncology         [phone]) [phone] ________________________________  Name: Marcus Kramer MRN: [id number]  Date: 04/13/2023  DOB: August 02, 1961  COMPLEX SIMULATION NOTE  NARRATIVE:  The patient was brought to the CT Simulation planning suite today following prostate seed implantation approximately one month ago.  Identity was confirmed.  All relevant records and images related to the planned course of therapy were reviewed.  Then, the patient was set-up supine.  CT images were obtained.  The CT images were loaded into the planning software.  Then the prostate and rectum were contoured.  Treatment planning then occurred.  The implanted iodine 125 seeds were identified by the physics staff for projection of radiation distribution  I have requested : 3D Simulation  I have requested a DVH of the following structures: Prostate and rectum.    ________________________________  Artist Pais Kathrynn Running, M.D.

## 2023-04-13 NOTE — Progress Notes (Signed)
  Radiation Oncology         [phone]) [phone] ________________________________  Name: Marcus Kramer MRN: [id number]  Date: 04/13/2023  DOB: 1961/04/11  SIMULATION AND TREATMENT PLANNING NOTE    ICD-10-CM   1. Malignant neoplasm of prostate (HCC)  C61       DIAGNOSIS:  62 y.o. 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   NARRATIVE:  The patient was brought to the CT Simulation planning suite.  Identity was confirmed.  All relevant records and images related to the planned course of therapy were reviewed.  The patient freely provided informed written consent to proceed with treatment after reviewing the details related to the planned course of therapy. The consent form was witnessed and verified by the simulation staff.  Then, the patient was set-up in a stable reproducible supine position for radiation therapy.  A vacuum lock pillow device was custom fabricated to position his legs in a reproducible immobilized position.  Then, I performed a urethrogram under sterile conditions to identify the prostatic apex.  CT images were obtained.  Surface markings were placed.  The CT images were loaded into the planning software.  Then the prostate target and avoidance structures including the rectum, bladder, bowel and hips were contoured.  Treatment planning then occurred.  The radiation prescription was entered and confirmed.  A total of one complex treatment devices were fabricated. I have requested : Intensity Modulated Radiotherapy (IMRT) is medically necessary for this case for the following reason:  Rectal sparing.Marland Kitchen  PLAN:  The patient will receive 45 Gy in 25 fractions of 1.8 Gy, to supplement an up-front prostate seed implant boost of 110 Gy to achieve a total nominal dose of 155 Gy.  ________________________________  Artist Pais Kathrynn Running, M.D.

## 2023-04-15 DIAGNOSIS — C61 Malignant neoplasm of prostate: Secondary | ICD-10-CM | POA: Diagnosis present

## 2023-04-15 DIAGNOSIS — Z510 Encounter for antineoplastic radiation therapy: Secondary | ICD-10-CM | POA: Diagnosis present

## 2023-04-27 ENCOUNTER — Ambulatory Visit
Admission: RE | Admit: 2023-04-27 | Payer: BC Managed Care – PPO | Source: Ambulatory Visit | Admitting: Radiation Oncology

## 2023-04-27 ENCOUNTER — Other Ambulatory Visit: Payer: Self-pay

## 2023-04-27 DIAGNOSIS — Z510 Encounter for antineoplastic radiation therapy: Secondary | ICD-10-CM | POA: Diagnosis not present

## 2023-04-27 DIAGNOSIS — C61 Malignant neoplasm of prostate: Secondary | ICD-10-CM

## 2023-04-27 LAB — RAD ONC ARIA SESSION SUMMARY
Course Elapsed Days: 0
Plan Fractions Treated to Date: 1
Plan Prescribed Dose Per Fraction: 1.8 Gy
Plan Total Fractions Prescribed: 25
Plan Total Prescribed Dose: 45 Gy
Reference Point Dosage Given to Date: 1.8 Gy
Reference Point Session Dosage Given: 1.8 Gy
Session Number: 1

## 2023-04-28 ENCOUNTER — Ambulatory Visit: Admission: RE | Admit: 2023-04-28 | Payer: BC Managed Care – PPO | Source: Ambulatory Visit

## 2023-04-28 ENCOUNTER — Other Ambulatory Visit: Payer: Self-pay

## 2023-04-28 DIAGNOSIS — Z510 Encounter for antineoplastic radiation therapy: Secondary | ICD-10-CM | POA: Diagnosis not present

## 2023-04-28 LAB — RAD ONC ARIA SESSION SUMMARY
Course Elapsed Days: 1
Plan Fractions Treated to Date: 2
Plan Prescribed Dose Per Fraction: 1.8 Gy
Plan Total Fractions Prescribed: 25
Plan Total Prescribed Dose: 45 Gy
Reference Point Dosage Given to Date: 3.6 Gy
Reference Point Session Dosage Given: 1.8 Gy
Session Number: 2

## 2023-04-29 ENCOUNTER — Other Ambulatory Visit: Payer: Self-pay

## 2023-04-29 ENCOUNTER — Ambulatory Visit: Admission: RE | Admit: 2023-04-29 | Payer: BC Managed Care – PPO | Source: Ambulatory Visit

## 2023-04-29 DIAGNOSIS — Z510 Encounter for antineoplastic radiation therapy: Secondary | ICD-10-CM | POA: Diagnosis not present

## 2023-04-29 LAB — RAD ONC ARIA SESSION SUMMARY
Course Elapsed Days: 2
Plan Fractions Treated to Date: 3
Plan Prescribed Dose Per Fraction: 1.8 Gy
Plan Total Fractions Prescribed: 25
Plan Total Prescribed Dose: 45 Gy
Reference Point Dosage Given to Date: 5.4 Gy
Reference Point Session Dosage Given: 1.8 Gy
Session Number: 3

## 2023-04-30 ENCOUNTER — Ambulatory Visit
Admission: RE | Admit: 2023-04-30 | Discharge: 2023-04-30 | Disposition: A | Discharge status: Home / Self Care | Payer: BC Managed Care – PPO | Source: Ambulatory Visit | Attending: Radiation Oncology | Admitting: Radiation Oncology

## 2023-04-30 ENCOUNTER — Other Ambulatory Visit: Payer: Self-pay

## 2023-04-30 DIAGNOSIS — Z510 Encounter for antineoplastic radiation therapy: Secondary | ICD-10-CM | POA: Diagnosis not present

## 2023-04-30 LAB — RAD ONC ARIA SESSION SUMMARY
Course Elapsed Days: 3
Plan Fractions Treated to Date: 4
Plan Prescribed Dose Per Fraction: 1.8 Gy
Plan Total Fractions Prescribed: 25
Plan Total Prescribed Dose: 45 Gy
Reference Point Dosage Given to Date: 7.2 Gy
Reference Point Session Dosage Given: 1.8 Gy
Session Number: 4

## 2023-05-01 ENCOUNTER — Ambulatory Visit
Admission: RE | Admit: 2023-05-01 | Discharge: 2023-05-01 | Disposition: A | Discharge status: Home / Self Care | Payer: BC Managed Care – PPO | Source: Ambulatory Visit | Attending: Radiation Oncology | Admitting: Radiation Oncology

## 2023-05-01 ENCOUNTER — Other Ambulatory Visit: Payer: Self-pay

## 2023-05-01 ENCOUNTER — Ambulatory Visit: Admission: RE | Admit: 2023-05-01 | Payer: BC Managed Care – PPO | Source: Ambulatory Visit

## 2023-05-01 DIAGNOSIS — Z510 Encounter for antineoplastic radiation therapy: Secondary | ICD-10-CM | POA: Diagnosis not present

## 2023-05-01 LAB — RAD ONC ARIA SESSION SUMMARY
Course Elapsed Days: 4
Plan Fractions Treated to Date: 5
Plan Prescribed Dose Per Fraction: 1.8 Gy
Plan Total Fractions Prescribed: 25
Plan Total Prescribed Dose: 45 Gy
Reference Point Dosage Given to Date: 9 Gy
Reference Point Session Dosage Given: 1.8 Gy
Session Number: 5

## 2023-05-04 ENCOUNTER — Other Ambulatory Visit: Payer: Self-pay

## 2023-05-04 ENCOUNTER — Ambulatory Visit
Admission: RE | Admit: 2023-05-04 | Discharge: 2023-05-04 | Disposition: A | Discharge status: Home / Self Care | Payer: BC Managed Care – PPO | Source: Ambulatory Visit | Attending: Radiation Oncology | Admitting: Radiation Oncology

## 2023-05-04 DIAGNOSIS — Z510 Encounter for antineoplastic radiation therapy: Secondary | ICD-10-CM | POA: Diagnosis not present

## 2023-05-04 LAB — RAD ONC ARIA SESSION SUMMARY
Course Elapsed Days: 7
Plan Fractions Treated to Date: 6
Plan Prescribed Dose Per Fraction: 1.8 Gy
Plan Total Fractions Prescribed: 25
Plan Total Prescribed Dose: 45 Gy
Reference Point Dosage Given to Date: 10.8 Gy
Reference Point Session Dosage Given: 1.8 Gy
Session Number: 6

## 2023-05-05 ENCOUNTER — Other Ambulatory Visit: Payer: Self-pay

## 2023-05-05 ENCOUNTER — Ambulatory Visit
Admission: RE | Admit: 2023-05-05 | Discharge: 2023-05-05 | Disposition: A | Discharge status: Home / Self Care | Payer: BC Managed Care – PPO | Source: Ambulatory Visit | Attending: Radiation Oncology | Admitting: Radiation Oncology

## 2023-05-05 DIAGNOSIS — Z510 Encounter for antineoplastic radiation therapy: Secondary | ICD-10-CM | POA: Diagnosis not present

## 2023-05-05 LAB — RAD ONC ARIA SESSION SUMMARY
Course Elapsed Days: 8
Plan Fractions Treated to Date: 7
Plan Prescribed Dose Per Fraction: 1.8 Gy
Plan Total Fractions Prescribed: 25
Plan Total Prescribed Dose: 45 Gy
Reference Point Dosage Given to Date: 12.6 Gy
Reference Point Session Dosage Given: 1.8 Gy
Session Number: 7

## 2023-05-06 ENCOUNTER — Ambulatory Visit
Admission: RE | Admit: 2023-05-06 | Discharge: 2023-05-06 | Disposition: A | Discharge status: Home / Self Care | Payer: BC Managed Care – PPO | Source: Ambulatory Visit | Attending: Radiation Oncology | Admitting: Radiation Oncology

## 2023-05-06 ENCOUNTER — Other Ambulatory Visit: Payer: Self-pay

## 2023-05-06 DIAGNOSIS — Z510 Encounter for antineoplastic radiation therapy: Secondary | ICD-10-CM | POA: Diagnosis not present

## 2023-05-06 LAB — RAD ONC ARIA SESSION SUMMARY
Course Elapsed Days: 9
Plan Fractions Treated to Date: 8
Plan Prescribed Dose Per Fraction: 1.8 Gy
Plan Total Fractions Prescribed: 25
Plan Total Prescribed Dose: 45 Gy
Reference Point Dosage Given to Date: 14.4 Gy
Reference Point Session Dosage Given: 1.8 Gy
Session Number: 8

## 2023-05-07 ENCOUNTER — Other Ambulatory Visit: Payer: Self-pay

## 2023-05-07 ENCOUNTER — Ambulatory Visit
Admission: RE | Admit: 2023-05-07 | Discharge: 2023-05-07 | Disposition: A | Discharge status: Home / Self Care | Payer: BC Managed Care – PPO | Source: Ambulatory Visit | Attending: Radiation Oncology | Admitting: Radiation Oncology

## 2023-05-07 DIAGNOSIS — C61 Malignant neoplasm of prostate: Secondary | ICD-10-CM | POA: Insufficient documentation

## 2023-05-07 DIAGNOSIS — Z510 Encounter for antineoplastic radiation therapy: Secondary | ICD-10-CM | POA: Diagnosis not present

## 2023-05-07 LAB — RAD ONC ARIA SESSION SUMMARY
Course Elapsed Days: 10
Plan Fractions Treated to Date: 9
Plan Prescribed Dose Per Fraction: 1.8 Gy
Plan Total Fractions Prescribed: 25
Plan Total Prescribed Dose: 45 Gy
Reference Point Dosage Given to Date: 16.2 Gy
Reference Point Session Dosage Given: 1.8 Gy
Session Number: 9

## 2023-05-08 ENCOUNTER — Ambulatory Visit
Admission: RE | Admit: 2023-05-08 | Discharge: 2023-05-08 | Disposition: A | Discharge status: Home / Self Care | Payer: BC Managed Care – PPO | Source: Ambulatory Visit | Attending: Radiation Oncology | Admitting: Radiation Oncology

## 2023-05-08 ENCOUNTER — Other Ambulatory Visit: Payer: Self-pay

## 2023-05-08 ENCOUNTER — Ambulatory Visit: Admission: RE | Admit: 2023-05-08 | Payer: BC Managed Care – PPO | Source: Ambulatory Visit

## 2023-05-08 DIAGNOSIS — Z510 Encounter for antineoplastic radiation therapy: Secondary | ICD-10-CM | POA: Diagnosis not present

## 2023-05-08 LAB — RAD ONC ARIA SESSION SUMMARY
Course Elapsed Days: 11
Plan Fractions Treated to Date: 10
Plan Prescribed Dose Per Fraction: 1.8 Gy
Plan Total Fractions Prescribed: 25
Plan Total Prescribed Dose: 45 Gy
Reference Point Dosage Given to Date: 18 Gy
Reference Point Session Dosage Given: 1.8 Gy
Session Number: 10

## 2023-05-11 ENCOUNTER — Ambulatory Visit: Admission: RE | Admit: 2023-05-11 | Payer: BC Managed Care – PPO | Source: Ambulatory Visit

## 2023-05-11 ENCOUNTER — Other Ambulatory Visit: Payer: Self-pay

## 2023-05-11 DIAGNOSIS — Z510 Encounter for antineoplastic radiation therapy: Secondary | ICD-10-CM | POA: Diagnosis not present

## 2023-05-11 LAB — RAD ONC ARIA SESSION SUMMARY
Course Elapsed Days: 14
Plan Fractions Treated to Date: 11
Plan Prescribed Dose Per Fraction: 1.8 Gy
Plan Total Fractions Prescribed: 25
Plan Total Prescribed Dose: 45 Gy
Reference Point Dosage Given to Date: 19.8 Gy
Reference Point Session Dosage Given: 1.8 Gy
Session Number: 11

## 2023-05-12 ENCOUNTER — Ambulatory Visit
Admission: RE | Admit: 2023-05-12 | Discharge: 2023-05-12 | Disposition: A | Discharge status: Home / Self Care | Payer: BC Managed Care – PPO | Source: Ambulatory Visit | Attending: Radiation Oncology | Admitting: Radiation Oncology

## 2023-05-12 ENCOUNTER — Other Ambulatory Visit: Payer: Self-pay

## 2023-05-12 DIAGNOSIS — Z510 Encounter for antineoplastic radiation therapy: Secondary | ICD-10-CM | POA: Diagnosis not present

## 2023-05-12 LAB — RAD ONC ARIA SESSION SUMMARY
Course Elapsed Days: 15
Plan Fractions Treated to Date: 12
Plan Prescribed Dose Per Fraction: 1.8 Gy
Plan Total Fractions Prescribed: 25
Plan Total Prescribed Dose: 45 Gy
Reference Point Dosage Given to Date: 21.6 Gy
Reference Point Session Dosage Given: 1.8 Gy
Session Number: 12

## 2023-05-13 ENCOUNTER — Other Ambulatory Visit: Payer: Self-pay

## 2023-05-13 ENCOUNTER — Ambulatory Visit
Admission: RE | Admit: 2023-05-13 | Discharge: 2023-05-13 | Disposition: A | Discharge status: Home / Self Care | Payer: BC Managed Care – PPO | Source: Ambulatory Visit | Attending: Radiation Oncology | Admitting: Radiation Oncology

## 2023-05-13 DIAGNOSIS — Z510 Encounter for antineoplastic radiation therapy: Secondary | ICD-10-CM | POA: Diagnosis not present

## 2023-05-13 LAB — RAD ONC ARIA SESSION SUMMARY
Course Elapsed Days: 16
Plan Fractions Treated to Date: 13
Plan Prescribed Dose Per Fraction: 1.8 Gy
Plan Total Fractions Prescribed: 25
Plan Total Prescribed Dose: 45 Gy
Reference Point Dosage Given to Date: 23.4 Gy
Reference Point Session Dosage Given: 1.8 Gy
Session Number: 13

## 2023-05-14 ENCOUNTER — Ambulatory Visit: Payer: BC Managed Care – PPO

## 2023-05-14 ENCOUNTER — Other Ambulatory Visit: Payer: Self-pay

## 2023-05-14 ENCOUNTER — Ambulatory Visit
Admission: RE | Admit: 2023-05-14 | Discharge: 2023-05-14 | Disposition: A | Discharge status: Home / Self Care | Payer: BC Managed Care – PPO | Source: Ambulatory Visit | Attending: Radiation Oncology | Admitting: Radiation Oncology

## 2023-05-14 DIAGNOSIS — Z510 Encounter for antineoplastic radiation therapy: Secondary | ICD-10-CM | POA: Diagnosis not present

## 2023-05-14 LAB — RAD ONC ARIA SESSION SUMMARY
Course Elapsed Days: 17
Plan Fractions Treated to Date: 14
Plan Prescribed Dose Per Fraction: 1.8 Gy
Plan Total Fractions Prescribed: 25
Plan Total Prescribed Dose: 45 Gy
Reference Point Dosage Given to Date: 25.2 Gy
Reference Point Session Dosage Given: 1.8 Gy
Session Number: 14

## 2023-05-15 ENCOUNTER — Ambulatory Visit
Admission: RE | Admit: 2023-05-15 | Discharge: 2023-05-15 | Disposition: A | Discharge status: Home / Self Care | Payer: BC Managed Care – PPO | Source: Ambulatory Visit | Attending: Radiation Oncology | Admitting: Radiation Oncology

## 2023-05-15 ENCOUNTER — Other Ambulatory Visit: Payer: Self-pay

## 2023-05-15 DIAGNOSIS — Z510 Encounter for antineoplastic radiation therapy: Secondary | ICD-10-CM | POA: Diagnosis not present

## 2023-05-15 LAB — RAD ONC ARIA SESSION SUMMARY
Course Elapsed Days: 18
Plan Fractions Treated to Date: 15
Plan Prescribed Dose Per Fraction: 1.8 Gy
Plan Total Fractions Prescribed: 25
Plan Total Prescribed Dose: 45 Gy
Reference Point Dosage Given to Date: 27 Gy
Reference Point Session Dosage Given: 1.8 Gy
Session Number: 15

## 2023-05-18 ENCOUNTER — Other Ambulatory Visit: Payer: Self-pay

## 2023-05-18 ENCOUNTER — Ambulatory Visit
Admission: RE | Admit: 2023-05-18 | Discharge: 2023-05-18 | Disposition: A | Discharge status: Home / Self Care | Payer: BC Managed Care – PPO | Source: Ambulatory Visit | Attending: Radiation Oncology | Admitting: Radiation Oncology

## 2023-05-18 DIAGNOSIS — Z510 Encounter for antineoplastic radiation therapy: Secondary | ICD-10-CM | POA: Diagnosis not present

## 2023-05-18 LAB — RAD ONC ARIA SESSION SUMMARY
Course Elapsed Days: 21
Plan Fractions Treated to Date: 16
Plan Prescribed Dose Per Fraction: 1.8 Gy
Plan Total Fractions Prescribed: 25
Plan Total Prescribed Dose: 45 Gy
Reference Point Dosage Given to Date: 28.8 Gy
Reference Point Session Dosage Given: 1.8 Gy
Session Number: 16

## 2023-05-19 ENCOUNTER — Ambulatory Visit
Admission: RE | Admit: 2023-05-19 | Discharge: 2023-05-19 | Disposition: A | Discharge status: Home / Self Care | Payer: BC Managed Care – PPO | Source: Ambulatory Visit | Attending: Radiation Oncology | Admitting: Radiation Oncology

## 2023-05-19 ENCOUNTER — Other Ambulatory Visit: Payer: Self-pay

## 2023-05-19 DIAGNOSIS — Z510 Encounter for antineoplastic radiation therapy: Secondary | ICD-10-CM | POA: Diagnosis not present

## 2023-05-19 LAB — RAD ONC ARIA SESSION SUMMARY
Course Elapsed Days: 22
Plan Fractions Treated to Date: 17
Plan Prescribed Dose Per Fraction: 1.8 Gy
Plan Total Fractions Prescribed: 25
Plan Total Prescribed Dose: 45 Gy
Reference Point Dosage Given to Date: 30.6 Gy
Reference Point Session Dosage Given: 1.8 Gy
Session Number: 17

## 2023-05-20 ENCOUNTER — Ambulatory Visit
Admission: RE | Admit: 2023-05-20 | Discharge: 2023-05-20 | Disposition: A | Discharge status: Home / Self Care | Payer: BC Managed Care – PPO | Source: Ambulatory Visit | Attending: Radiation Oncology | Admitting: Radiation Oncology

## 2023-05-20 ENCOUNTER — Other Ambulatory Visit: Payer: Self-pay

## 2023-05-20 DIAGNOSIS — Z510 Encounter for antineoplastic radiation therapy: Secondary | ICD-10-CM | POA: Diagnosis not present

## 2023-05-20 LAB — RAD ONC ARIA SESSION SUMMARY
Course Elapsed Days: 23
Plan Fractions Treated to Date: 18
Plan Prescribed Dose Per Fraction: 1.8 Gy
Plan Total Fractions Prescribed: 25
Plan Total Prescribed Dose: 45 Gy
Reference Point Dosage Given to Date: 32.4 Gy
Reference Point Session Dosage Given: 1.8 Gy
Session Number: 18

## 2023-05-21 ENCOUNTER — Other Ambulatory Visit: Payer: Self-pay

## 2023-05-21 ENCOUNTER — Ambulatory Visit
Admission: RE | Admit: 2023-05-21 | Discharge: 2023-05-21 | Disposition: A | Discharge status: Home / Self Care | Payer: BC Managed Care – PPO | Source: Ambulatory Visit | Attending: Radiation Oncology | Admitting: Radiation Oncology

## 2023-05-21 DIAGNOSIS — Z510 Encounter for antineoplastic radiation therapy: Secondary | ICD-10-CM | POA: Diagnosis not present

## 2023-05-21 LAB — RAD ONC ARIA SESSION SUMMARY
Course Elapsed Days: 24
Plan Fractions Treated to Date: 19
Plan Prescribed Dose Per Fraction: 1.8 Gy
Plan Total Fractions Prescribed: 25
Plan Total Prescribed Dose: 45 Gy
Reference Point Dosage Given to Date: 34.2 Gy
Reference Point Session Dosage Given: 1.8 Gy
Session Number: 19

## 2023-05-22 ENCOUNTER — Other Ambulatory Visit: Payer: Self-pay

## 2023-05-22 ENCOUNTER — Ambulatory Visit: Admission: RE | Admit: 2023-05-22 | Payer: BC Managed Care – PPO | Source: Ambulatory Visit

## 2023-05-22 DIAGNOSIS — Z510 Encounter for antineoplastic radiation therapy: Secondary | ICD-10-CM | POA: Diagnosis not present

## 2023-05-22 LAB — RAD ONC ARIA SESSION SUMMARY
Course Elapsed Days: 25
Plan Fractions Treated to Date: 20
Plan Prescribed Dose Per Fraction: 1.8 Gy
Plan Total Fractions Prescribed: 25
Plan Total Prescribed Dose: 45 Gy
Reference Point Dosage Given to Date: 36 Gy
Reference Point Session Dosage Given: 1.8 Gy
Session Number: 20

## 2023-05-25 ENCOUNTER — Other Ambulatory Visit: Payer: Self-pay

## 2023-05-25 ENCOUNTER — Ambulatory Visit: Admission: RE | Admit: 2023-05-25 | Payer: BC Managed Care – PPO | Source: Ambulatory Visit

## 2023-05-25 DIAGNOSIS — Z510 Encounter for antineoplastic radiation therapy: Secondary | ICD-10-CM | POA: Diagnosis not present

## 2023-05-25 LAB — RAD ONC ARIA SESSION SUMMARY
Course Elapsed Days: 28
Plan Fractions Treated to Date: 21
Plan Prescribed Dose Per Fraction: 1.8 Gy
Plan Total Fractions Prescribed: 25
Plan Total Prescribed Dose: 45 Gy
Reference Point Dosage Given to Date: 37.8 Gy
Reference Point Session Dosage Given: 1.8 Gy
Session Number: 21

## 2023-05-26 ENCOUNTER — Other Ambulatory Visit: Payer: Self-pay

## 2023-05-26 ENCOUNTER — Ambulatory Visit
Admission: RE | Admit: 2023-05-26 | Discharge: 2023-05-26 | Disposition: A | Discharge status: Home / Self Care | Payer: BC Managed Care – PPO | Source: Ambulatory Visit | Attending: Radiation Oncology | Admitting: Radiation Oncology

## 2023-05-26 DIAGNOSIS — Z510 Encounter for antineoplastic radiation therapy: Secondary | ICD-10-CM | POA: Diagnosis not present

## 2023-05-26 LAB — RAD ONC ARIA SESSION SUMMARY
Course Elapsed Days: 29
Plan Fractions Treated to Date: 22
Plan Prescribed Dose Per Fraction: 1.8 Gy
Plan Total Fractions Prescribed: 25
Plan Total Prescribed Dose: 45 Gy
Reference Point Dosage Given to Date: 39.6 Gy
Reference Point Session Dosage Given: 1.8 Gy
Session Number: 22

## 2023-05-27 ENCOUNTER — Other Ambulatory Visit: Payer: Self-pay

## 2023-05-27 ENCOUNTER — Ambulatory Visit
Admission: RE | Admit: 2023-05-27 | Discharge: 2023-05-27 | Disposition: A | Discharge status: Home / Self Care | Payer: BC Managed Care – PPO | Source: Ambulatory Visit | Attending: Radiation Oncology | Admitting: Radiation Oncology

## 2023-05-27 DIAGNOSIS — Z510 Encounter for antineoplastic radiation therapy: Secondary | ICD-10-CM | POA: Diagnosis not present

## 2023-05-27 LAB — RAD ONC ARIA SESSION SUMMARY
Course Elapsed Days: 30
Plan Fractions Treated to Date: 23
Plan Prescribed Dose Per Fraction: 1.8 Gy
Plan Total Fractions Prescribed: 25
Plan Total Prescribed Dose: 45 Gy
Reference Point Dosage Given to Date: 41.4 Gy
Reference Point Session Dosage Given: 1.8 Gy
Session Number: 23

## 2023-05-28 ENCOUNTER — Other Ambulatory Visit: Payer: Self-pay

## 2023-05-28 ENCOUNTER — Ambulatory Visit
Admission: RE | Admit: 2023-05-28 | Discharge: 2023-05-28 | Disposition: A | Discharge status: Home / Self Care | Payer: BC Managed Care – PPO | Source: Ambulatory Visit | Attending: Radiation Oncology | Admitting: Radiation Oncology

## 2023-05-28 DIAGNOSIS — Z510 Encounter for antineoplastic radiation therapy: Secondary | ICD-10-CM | POA: Diagnosis not present

## 2023-05-28 LAB — RAD ONC ARIA SESSION SUMMARY
Course Elapsed Days: 31
Plan Fractions Treated to Date: 24
Plan Prescribed Dose Per Fraction: 1.8 Gy
Plan Total Fractions Prescribed: 25
Plan Total Prescribed Dose: 45 Gy
Reference Point Dosage Given to Date: 43.2 Gy
Reference Point Session Dosage Given: 1.8 Gy
Session Number: 24

## 2023-05-29 ENCOUNTER — Other Ambulatory Visit: Payer: Self-pay

## 2023-05-29 ENCOUNTER — Ambulatory Visit
Admission: RE | Admit: 2023-05-29 | Discharge: 2023-05-29 | Disposition: A | Discharge status: Home / Self Care | Payer: BC Managed Care – PPO | Source: Ambulatory Visit | Attending: Radiation Oncology | Admitting: Radiation Oncology

## 2023-05-29 DIAGNOSIS — C61 Malignant neoplasm of prostate: Secondary | ICD-10-CM

## 2023-05-29 DIAGNOSIS — Z510 Encounter for antineoplastic radiation therapy: Secondary | ICD-10-CM | POA: Diagnosis not present

## 2023-05-29 LAB — RAD ONC ARIA SESSION SUMMARY
Course Elapsed Days: 32
Plan Fractions Treated to Date: 25
Plan Prescribed Dose Per Fraction: 1.8 Gy
Plan Total Fractions Prescribed: 25
Plan Total Prescribed Dose: 45 Gy
Reference Point Dosage Given to Date: 45 Gy
Reference Point Session Dosage Given: 1.8 Gy
Session Number: 25

## 2023-06-01 NOTE — Radiation Completion Notes (Addendum)
Radiation Oncology         [phone] ________________________________  Name: LEONTE RAVELING MRN: [id number]  Date: 05/29/2023  DOB: October 22, 1960  Patient Name: Marcus Kramer, Marcus Kramer MRN: [id number] Date of Birth: [birth year] Referring Physician: Crecencio Mc, M.D. Date of Service: 2023-06-01 Radiation Oncologist: Margaretmary Bayley, M.D. [facility]   RADIATION ONCOLOGY END OF TREATMENT NOTE     Diagnosis: 62 y.o. 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   Intent: Curative     ==========DELIVERED PLANS==========  First Treatment Date: 2023-04-27 - Last Treatment Date: 2023-05-29   Plan Name: Prostate_Pelv Site: Prostate Technique: IMRT Mode: Photon Dose Per Fraction: 1.8 Gy Prescribed Dose (Delivered / Prescribed): 45 Gy / 45 Gy Prescribed Fxs (Delivered / Prescribed): 25 / 25   04/02/23:   Insertion of radioactive I-125 seeds into the prostate gland; 110 Gy, boost therapy.   ==========ON TREATMENT VISIT DATES========== 2023-05-01, 2023-05-08, 2023-05-15, 2023-05-22, 2023-05-29   See weekly On Treatment Notes in Epic for details.  He tolerated the radiation treatments fairly well with increased nocturia and modest fatigue.  The patient will receive a call in about one month from the radiation oncology department. He will continue follow up with his urologist, Dr. Laverle Patter, as well.  ------------------------------------------------   Margaretmary Dys, MD [facility]  Radiation Oncology Direct Dial: [phone]  Fax: [phone] [email or web address]  Skype  LinkedIn

## 2023-06-11 NOTE — Progress Notes (Signed)
Patient was a RadOnc Consult on 12/17/22 for his stage T1c (radT3) adenocarcinoma of the prostate with Gleason score of 4+3, and PSA of 2.45.  Patient proceed with treatment recommendations of ADT with 5 weeks of external radiation and an upfront brachytherapy boost and had his final radiation treatment on 05/29/23.   Patient is scheduled for a post treatment nurse call on 07/14/23 and has his first post treatment PSA on 07/15/23 at [facility].

## 2023-07-14 ENCOUNTER — Ambulatory Visit
Admission: RE | Admit: 2023-07-14 | Discharge: 2023-07-14 | Disposition: A | Discharge status: Home / Self Care | Payer: BC Managed Care – PPO | Source: Ambulatory Visit | Attending: Radiation Oncology | Admitting: Radiation Oncology

## 2023-07-14 NOTE — Progress Notes (Signed)
Radiation Oncology         [phone]) [phone] ________________________________  Name: Marcus Kramer MRN: [id number]  Date of Service: 07/14/2023  DOB: [birth year]  Post Treatment Telephone Note  Diagnosis:  C61 Malignant neoplasm of prostate (as documented in provider EOT note)  Pre Treatment IPSS Score: 7 (as documented in the provider consult note)  The patient was available for call today.   Symptoms of fatigue have improved since completing therapy.  Symptoms of bladder changes have improved since completing therapy. Current symptoms include polyuria, and medications for bladder symptoms include Tamsulosin.  Symptoms of bowel changes have improved since completing therapy. Current symptoms include loose stools, and medications for bowel symptoms include Imodium.   Patient currently reports hematuria, starting   Post Treatment IPSS Score: IPSS Questionnaire (AUA-7): Over the past month.   1)  How often have you had a sensation of not emptying your bladder completely after you finish urinating?  1 - Less than 1 time in 5  2)  How often have you had to urinate again less than two hours after you finished urinating? 1 - Less than 1 time in 5  3)  How often have you found you stopped and started again several times when you urinated?  2 - Less than half the time  4) How difficult have you found it to postpone urination?  1 - Less than 1 time in 5  5) How often have you had a weak urinary stream?  2 - Less than half the time  6) How often have you had to push or strain to begin urination?  0 - Not at all  7) How many times did you most typically get up to urinate from the time you went to bed until the time you got up in the morning?  3 - 3 times  Total score:  10. Which indicates moderate symptoms  0-7 mildly symptomatic   8-19 moderately symptomatic   20-35 severely symptomatic    Patient (has a scheduled follow up visit with his urologist, Dr. Laverle Patter, on 07/2023 for ongoing  surveillance. He was counseled that PSA levels will be drawn in the urology office, and was reassured that additional time is expected to improve bowel and bladder symptoms. He was encouraged to call back with concerns or questions regarding radiation.   This concludes the interaction.  Ruel Favors, LPN

## 2023-07-16 ENCOUNTER — Other Ambulatory Visit: Payer: Self-pay | Admitting: Urology

## 2023-07-16 DIAGNOSIS — C61 Malignant neoplasm of prostate: Secondary | ICD-10-CM

## 2023-07-24 ENCOUNTER — Encounter: Payer: Self-pay | Admitting: *Deleted

## 2023-07-30 ENCOUNTER — Encounter: Payer: Self-pay | Admitting: *Deleted

## 2023-08-20 ENCOUNTER — Encounter: Payer: Self-pay | Admitting: *Deleted

## 2023-08-20 ENCOUNTER — Inpatient Hospital Stay: Payer: BC Managed Care – PPO | Attending: Adult Health | Admitting: *Deleted

## 2023-08-20 DIAGNOSIS — C61 Malignant neoplasm of prostate: Secondary | ICD-10-CM

## 2023-08-20 NOTE — Progress Notes (Signed)
SCP reviewed and completed. Latest PSA labs 0.033 on 07/15/2023 at [facility]. Colonoscopy will be performed 09/01/23 In [place]. I have sent a copy of pt's treatment summary to Dr. Hyacinth Meeker (PCP).

## 2023-09-01 ENCOUNTER — Ambulatory Visit: Payer: BC Managed Care – PPO

## 2023-09-01 DIAGNOSIS — K219 Gastro-esophageal reflux disease without esophagitis: Secondary | ICD-10-CM | POA: Diagnosis not present

## 2023-09-01 DIAGNOSIS — Z1211 Encounter for screening for malignant neoplasm of colon: Secondary | ICD-10-CM | POA: Diagnosis present

## 2023-09-01 DIAGNOSIS — Z800 Family history of malignant neoplasm of digestive organs: Secondary | ICD-10-CM | POA: Diagnosis not present

## 2023-09-01 DIAGNOSIS — K3189 Other diseases of stomach and duodenum: Secondary | ICD-10-CM | POA: Diagnosis not present

## 2023-09-01 DIAGNOSIS — K5730 Diverticulosis of large intestine without perforation or abscess without bleeding: Secondary | ICD-10-CM | POA: Diagnosis not present

## 2023-09-01 DIAGNOSIS — K6389 Other specified diseases of intestine: Secondary | ICD-10-CM | POA: Diagnosis not present

## 2023-09-01 DIAGNOSIS — K640 First degree hemorrhoids: Secondary | ICD-10-CM | POA: Diagnosis not present

## 2023-09-14 ENCOUNTER — Encounter: Payer: Self-pay | Admitting: Ophthalmology

## 2023-09-16 NOTE — Anesthesia Preprocedure Evaluation (Addendum)
Anesthesia Evaluation  Patient identified by MRN, date of birth, ID band Patient awake    Reviewed: Allergy & Precautions, H&P , NPO status , Patient's Chart, lab work & pertinent test results  Airway Mallampati: II  TM Distance: >3 FB Neck ROM: Full    Dental no notable dental hx.  Right upper lateral incisor is composite, has broken off several times and been repaired, looks intact now. None loose:   Pulmonary neg pulmonary ROS   Pulmonary exam normal breath sounds clear to auscultation       Cardiovascular hypertension, negative cardio ROS Normal cardiovascular exam Rhythm:Regular Rate:Normal     Neuro/Psych  PSYCHIATRIC DISORDERS Anxiety      Neuromuscular disease negative neurological ROS  negative psych ROS   GI/Hepatic negative GI ROS, Neg liver ROS,GERD  ,,  Endo/Other  negative endocrine ROS    Renal/GU negative Renal ROS  negative genitourinary   Musculoskeletal negative musculoskeletal ROS (+) Arthritis ,    Abdominal   Peds negative pediatric ROS (+)  Hematology negative hematology ROS (+)   Anesthesia Other Findings GAD (generalized anxiety disorder)  GERD (gastroesophageal reflux disease) Chronic gout  Hypertension History of basal cell carcinoma (BCC) excision  Malignant neoplasm prostate (HCC) Hyperlipidemia  ED (erectile dysfunction) Family history of colon cancer  Family history of glioblastoma Family history of kidney cancer  History of multiple concussions DDD (degenerative disc disease), lumbosacral  Nocturia Wears contact lenses  Sciatica of right side    Reproductive/Obstetrics negative OB ROS                             Anesthesia Physical Anesthesia Plan  ASA: 2  Anesthesia Plan: MAC   Post-op Pain Management:    Induction: Intravenous  PONV Risk Score and Plan:   Airway Management Planned: Natural Airway and Nasal Cannula  Additional  Equipment:   Intra-op Plan:   Post-operative Plan:   Informed Consent: I have reviewed the patients History and Physical, chart, labs and discussed the procedure including the risks, benefits and alternatives for the proposed anesthesia with the patient or authorized representative who has indicated his/her understanding and acceptance.     Dental Advisory Given  Plan Discussed with: Anesthesiologist, CRNA and Surgeon  Anesthesia Plan Comments: (Patient consented for risks of anesthesia including but not limited to:  - adverse reactions to medications - damage to eyes, teeth, lips or other oral mucosa - nerve damage due to positioning  - sore throat or hoarseness - Damage to heart, brain, nerves, lungs, other parts of body or loss of life  Patient voiced understanding and assent.)       Anesthesia Quick Evaluation

## 2023-09-17 NOTE — Discharge Instructions (Signed)

## 2023-09-18 ENCOUNTER — Other Ambulatory Visit: Payer: Self-pay | Admitting: Internal Medicine

## 2023-09-18 DIAGNOSIS — E782 Mixed hyperlipidemia: Secondary | ICD-10-CM

## 2023-09-18 DIAGNOSIS — C61 Malignant neoplasm of prostate: Secondary | ICD-10-CM

## 2023-09-21 ENCOUNTER — Ambulatory Visit
Admission: RE | Admit: 2023-09-21 | Discharge: 2023-09-21 | Disposition: A | Discharge status: Home / Self Care | Payer: Self-pay | Source: Ambulatory Visit | Attending: Internal Medicine | Admitting: Internal Medicine

## 2023-09-21 DIAGNOSIS — E782 Mixed hyperlipidemia: Secondary | ICD-10-CM | POA: Insufficient documentation

## 2023-09-21 DIAGNOSIS — C61 Malignant neoplasm of prostate: Secondary | ICD-10-CM | POA: Insufficient documentation

## 2023-09-22 ENCOUNTER — Ambulatory Visit
Admission: RE | Admit: 2023-09-22 | Discharge: 2023-09-22 | Disposition: A | Discharge status: Home / Self Care | Payer: BC Managed Care – PPO | Attending: Ophthalmology | Admitting: Ophthalmology

## 2023-09-22 ENCOUNTER — Encounter
Admission: RE | Disposition: A | Discharge status: Home / Self Care | Payer: Self-pay | Source: Home / Self Care | Attending: Ophthalmology

## 2023-09-22 ENCOUNTER — Ambulatory Visit: Payer: BC Managed Care – PPO | Admitting: Anesthesiology

## 2023-09-22 ENCOUNTER — Other Ambulatory Visit: Payer: Self-pay

## 2023-09-22 DIAGNOSIS — I10 Essential (primary) hypertension: Secondary | ICD-10-CM | POA: Diagnosis not present

## 2023-09-22 DIAGNOSIS — H2511 Age-related nuclear cataract, right eye: Secondary | ICD-10-CM | POA: Diagnosis present

## 2023-09-22 HISTORY — PX: CATARACT EXTRACTION W/PHACO: SHX586

## 2023-09-22 HISTORY — DX: Sciatica, right side: M54.31

## 2023-09-22 SURGERY — PHACOEMULSIFICATION, CATARACT, WITH IOL INSERTION
Anesthesia: Monitor Anesthesia Care | Laterality: Right

## 2023-09-22 MED ORDER — ARMC OPHTHALMIC DILATING DROPS
1.0000 | OPHTHALMIC | Status: DC | PRN
  Administered 2023-09-22 (×3): 1 via OPHTHALMIC

## 2023-09-22 MED ORDER — FENTANYL CITRATE (PF) 100 MCG/2ML IJ SOLN
INTRAMUSCULAR | Status: AC
  Filled 2023-09-22: qty 2

## 2023-09-22 MED ORDER — MIDAZOLAM HCL 2 MG/2ML IJ SOLN
INTRAMUSCULAR | Status: DC | PRN
  Administered 2023-09-22: 2 mg via INTRAVENOUS

## 2023-09-22 MED ORDER — LIDOCAINE HCL (PF) 2 % IJ SOLN
INTRAOCULAR | Status: DC | PRN
  Administered 2023-09-22: 4 mL via INTRAOCULAR

## 2023-09-22 MED ORDER — MIDAZOLAM HCL 2 MG/2ML IJ SOLN
INTRAMUSCULAR | Status: AC
  Filled 2023-09-22: qty 2

## 2023-09-22 MED ORDER — TETRACAINE HCL 0.5 % OP SOLN
OPHTHALMIC | Status: AC
  Filled 2023-09-22: qty 4

## 2023-09-22 MED ORDER — MOXIFLOXACIN HCL 0.5 % OP SOLN
OPHTHALMIC | Status: DC | PRN
  Administered 2023-09-22: .2 mL via OPHTHALMIC

## 2023-09-22 MED ORDER — SIGHTPATH DOSE#1 NA HYALUR & NA CHOND-NA HYALUR IO KIT
PACK | INTRAOCULAR | Status: DC | PRN
  Administered 2023-09-22: 1 via OPHTHALMIC

## 2023-09-22 MED ORDER — FENTANYL CITRATE (PF) 100 MCG/2ML IJ SOLN
INTRAMUSCULAR | Status: DC | PRN
  Administered 2023-09-22 (×2): 50 ug via INTRAVENOUS

## 2023-09-22 MED ORDER — BRIMONIDINE TARTRATE-TIMOLOL 0.2-0.5 % OP SOLN
OPHTHALMIC | Status: DC | PRN
  Administered 2023-09-22: 1 [drp] via OPHTHALMIC

## 2023-09-22 MED ORDER — SIGHTPATH DOSE#1 BSS IO SOLN
INTRAOCULAR | Status: DC | PRN
  Administered 2023-09-22: 15 mL via INTRAOCULAR

## 2023-09-22 MED ORDER — SIGHTPATH DOSE#1 BSS IO SOLN
INTRAOCULAR | Status: DC | PRN
  Administered 2023-09-22: 78 mL via OPHTHALMIC

## 2023-09-22 MED ORDER — TETRACAINE HCL 0.5 % OP SOLN
1.0000 [drp] | OPHTHALMIC | Status: DC | PRN
  Administered 2023-09-22 (×3): 1 [drp] via OPHTHALMIC

## 2023-09-22 SURGICAL SUPPLY — 10 items
CATARACT SUITE SIGHTPATH (MISCELLANEOUS) ×1
DISSECTOR HYDRO NUCLEUS 50X22 (MISCELLANEOUS) ×1 IMPLANT
DRSG TEGADERM 2-3/8X2-3/4 SM (GAUZE/BANDAGES/DRESSINGS) ×1 IMPLANT
FEE CATARACT SUITE SIGHTPATH (MISCELLANEOUS) ×1 IMPLANT
GLOVE SURG SYN 7.5 E (GLOVE) ×1
GLOVE SURG SYN 7.5 PF PI (GLOVE) ×1 IMPLANT
GLOVE SURG SYN 8.5 E (GLOVE) ×1
GLOVE SURG SYN 8.5 PF PI (GLOVE) ×1 IMPLANT
LENS CLAREON VIVITY 10.0 ×1 IMPLANT
LENS IOL CLRN VT TRC 4 10.0 IMPLANT

## 2023-09-22 NOTE — H&P (Signed)
[facility]   Primary Care Physician:  Danella Penton, MD Ophthalmologist: Dr. Deberah Pelton  Pre-Procedure History & Physical: HPI:  Marcus Kramer is a 62 y.o. male here for cataract surgery.   Past Medical History:  Diagnosis Date   Chronic gout    followed by pcp;  per pt right great toe   DDD (degenerative disc disease), lumbosacral    ED (erectile dysfunction)    Family history of colon cancer    Family history of glioblastoma    Family history of kidney cancer    GAD (generalized anxiety disorder)    GERD (gastroesophageal reflux disease)    History of basal cell carcinoma (BCC) excision 10/2021   History of multiple concussions 09/2022   followed by pcp  (per pt in high school and age 62 concussion w/ brief LOC both times;   12/ 2023 took new bp med / muscle relaxant with brief loc , headache next day,  stated no issue since)    (CT imaging in epic 09-16-2022 showing old infarts )   Hyperlipidemia    Hypertension    Malignant neoplasm prostate (HCC) 11/06/2022   urologist-- dr borden/  radiation onologist--- dr Kathrynn Running;  dx 02/ 2024, Gleason 4+3   Nocturia    Sciatica of right side    Wears contact lenses     Past Surgical History:  Procedure Laterality Date   BRONCHOSCOPY  2016   @ [facility]  (per pt benign)   COLONOSCOPY  2019   INGUINAL HERNIA REPAIR Left 2001   and per pt vasectomy done also   MOHS SURGERY  10/2021   nose (bcc)   RADIOACTIVE SEED IMPLANT N/A 04/02/2023   Procedure: RADIOACTIVE SEED IMPLANT/BRACHYTHERAPY IMPLANT;  Surgeon: Heloise Purpura, MD;  Location: [facility];  Service: Urology;  Laterality: N/A;   RETINAL DETACHMENT SURGERY Right 2014   SPACE OAR INSTILLATION N/A 04/02/2023   Procedure: SPACE OAR INSTILLATION;  Surgeon: Heloise Purpura, MD;  Location: [facility];  Service: Urology;  Laterality: N/A;    Prior to Admission medications   Medication Sig Start Date End Date Taking? Authorizing Provider   allopurinol (ZYLOPRIM) 300 MG tablet Take 300 mg by mouth daily. 10/28/16  Yes [provider]  Brimonidine Tartrate (LUMIFY) 0.025 % SOLN Apply to eye as needed.   Yes [provider]  Carboxymethylcellulose Sodium (REFRESH TEARS OP) Apply to eye.   Yes [provider]  diclofenac (VOLTAREN) 75 MG EC tablet Take 75 mg by mouth daily. 08/17/23  Yes [provider]  losartan (COZAAR) 100 MG tablet Take 100 mg by mouth daily. 11/12/22  Yes [provider]  omeprazole (PRILOSEC) 40 MG capsule Take 40 mg by mouth daily. 10/28/16  Yes [provider]  sildenafil (REVATIO) 20 MG tablet Take 20 mg by mouth as needed (ED). 07/29/16  Yes [provider]  tamsulosin (FLOMAX) 0.4 MG CAPS capsule Take 1 capsule (0.4 mg total) by mouth at bedtime. 04/02/23  Yes Heloise Purpura, MD  venlafaxine XR (EFFEXOR-XR) 75 MG 24 hr capsule Take 75 mg by mouth daily. 11/18/22  Yes [provider]  diphenhydramine-acetaminophen (TYLENOL PM) 25-500 MG TABS tablet Take 1 tablet by mouth at bedtime as needed. Patient not taking: Reported on 09/14/2023    [provider]    Allergies as of 08/18/2023 - Review Complete 07/16/2023  Allergen Reaction Noted   Sulfa antibiotics Other (See Comments)     Family History  Problem  Relation Age of Onset   Brain cancer Mother 9   Colon cancer Mother 70       2nd at 70   Other Mother 24       'male cancer"   Kidney cancer Mother 83   Stroke Father    Parkinson's disease Father    Prostate cancer Father        prostate cancer x2   Melanoma Brother 96   Brain cancer Maternal Grandmother 49       glioblastoma   Heart attack Maternal Grandfather    Lung cancer Maternal Grandfather    Parkinson's disease Paternal Grandfather    Stroke Paternal Grandfather     Social History   Socioeconomic History   Marital status: Married    Spouse name: Not on file   Number of children: Not on file   Years  of education: Not on file   Highest education level: Not on file  Occupational History   Not on file  Tobacco Use   Smoking status: Never   Smokeless tobacco: Never  Vaping Use   Vaping status: Never Used  Substance and Sexual Activity   Alcohol use: Yes    Alcohol/week: 14.0 standard drinks of alcohol    Types: 14 Standard drinks or equivalent per week   Drug use: Never   Sexual activity: Yes    Birth control/protection: Surgical  Other Topics Concern   Not on file  Social History Narrative   Not on file   Social Drivers of Health   Financial Resource Strain: Not on file  Food Insecurity: No Food Insecurity (12/17/2022)   Hunger Vital Sign    Worried About Running Out of Food in the Last Year: Never true    Ran Out of Food in the Last Year: Never true  Transportation Needs: No Transportation Needs (12/17/2022)   PRAPARE - Administrator, Civil Service (Medical): No    Lack of Transportation (Non-Medical): No  Physical Activity: Not on file  Stress: Not on file  Social Connections: Not on file  Intimate Partner Violence: Not At Risk (12/17/2022)   Humiliation, Afraid, Rape, and Kick questionnaire    Fear of Current or Ex-Partner: No    Emotionally Abused: No    Physically Abused: No    Sexually Abused: No    Review of Systems: See HPI, otherwise negative ROS  Physical Exam: Ht 5\' 10"  (1.778 m)   Wt 93 kg   BMI 29.41 kg/m  General:   Alert, cooperative in NAD Head:  Normocephalic and atraumatic. Respiratory:  Normal work of breathing. Cardiovascular:  RRR  Impression/Plan: Marcus Kramer is here for cataract surgery.  Risks, benefits, limitations, and alternatives regarding cataract surgery have been reviewed with the patient.  Questions have been answered.  All parties agreeable.   Estanislado Pandy, MD  09/22/2023, 11:36 AM

## 2023-09-22 NOTE — Op Note (Signed)
OPERATIVE NOTE  Marcus Kramer [id number] 09/22/2023   PREOPERATIVE DIAGNOSIS: Nuclear sclerotic cataract right eye. H25.11   POSTOPERATIVE DIAGNOSIS: Nuclear sclerotic cataract right eye. H25.11   PROCEDURE:  Phacoemusification with Toric posterior chamber intraocular lens placement of the right eye  Ultrasound time: Procedure(s): CATARACT EXTRACTION PHACO AND INTRAOCULAR LENS PLACEMENT (IOC) RIGHT OMIDRIA  CLAREON VIVITY TORIC 7.45 00:45.9 (Right)  LENS:   Implant Name Type Inv. Item Serial No. Manufacturer Lot No. LRB No. Used Action  LENS CLAREON VIVITY 10.0 - [id number]  LENS CLAREON VIVITY 10.0 [id number] SIGHTPATH  Right 1 Implanted    CNWET4 Toric intraocular lens with 2.25 diopters of cylindrical power with axis orientation at 086 degrees.  SURGEON:  Julious Payer. Rolley Sims, MD   ANESTHESIA:  Topical with tetracaine drops, augmented with 1% preservative-free intracameral lidocaine.   COMPLICATIONS:  None.   DESCRIPTION OF PROCEDURE:  The patient was identified in the holding room and transported to the operating room and placed in the supine position under the operating microscope.  The right eye was identified as the operative eye, which was prepped and draped in the usual sterile ophthalmic fashion.   A 1 millimeter clear-corneal paracentesis was made superotemporally. Preservative-free 1% lidocaine mixed with 1:1,000 bisulfite-free aqueous solution of epinephrine was injected into the anterior chamber. The anterior chamber was then filled with Viscoat viscoelastic. A 2.4 millimeter keratome was used to make a clear-corneal incision inferotemporally. A curvilinear capsulorrhexis was made with a cystotome and capsulorrhexis forceps. Balanced salt solution was used to hydrodissect and hydrodelineate the nucleus. Phacoemulsification was then used to remove the lens nucleus and epinucleus. The remaining cortex was then removed using the irrigation and aspiration handpiece. Provisc was  then placed into the capsular bag to distend it for lens placement. The Verion digital marker was used to align the implant at the intended axis.  A +10.00 D CNWET4 Toric lens was then injected into the capsular bag.  It was rotated clockwise until the axis marks on the lens were approximately 15 degrees in the counterclockwise direction to the intended alignment.  The viscoelastic was aspirated from the eye using the irrigation aspiration handpiece.  Then, a blunt chopper through the sideport incision was used to rotate the lens in a clockwise direction until the axis markings of the intraocular lens were lined up with the Verion alignment.  Balanced salt solution was then used to hydrate the wounds.   The anterior chamber was inflated to a physiologic pressure with balanced salt solution.  No wound leaks were noted. Vigamox was injected intracamerally.  Timolol and Brimonidine drops were applied to the eye.  The patient was taken to the recovery room in stable condition without complications of anesthesia or surgery.  [name] 09/22/2023, 1:00 PM

## 2023-09-22 NOTE — Anesthesia Postprocedure Evaluation (Signed)
Anesthesia Post Note  Patient: Marcus Kramer  Procedure(s) Performed: CATARACT EXTRACTION PHACO AND INTRAOCULAR LENS PLACEMENT (IOC) RIGHT OMIDRIA  CLAREON VIVITY TORIC 7.45 00:45.9 (Right)  Patient location during evaluation: PACU Anesthesia Type: MAC Level of consciousness: awake and alert Pain management: pain level controlled Vital Signs Assessment: post-procedure vital signs reviewed and stable Respiratory status: spontaneous breathing, nonlabored ventilation, respiratory function stable and patient connected to nasal cannula oxygen Cardiovascular status: stable and blood pressure returned to baseline Postop Assessment: no apparent nausea or vomiting Anesthetic complications: no   No notable events documented.   Last Vitals:  Vitals:   09/22/23 1315 09/22/23 1317  BP: (!) 116/92 127/88  Pulse:    Resp:    Temp:    SpO2: 98% 97%    Last Pain:  Vitals:   09/22/23 1317  TempSrc:   PainSc: 0-No pain                 Randall Colden C Raylynne Cubbage

## 2023-09-22 NOTE — Transfer of Care (Signed)
Immediate Anesthesia Transfer of Care Note  Patient: Marcus Kramer  Procedure(s) Performed: CATARACT EXTRACTION PHACO AND INTRAOCULAR LENS PLACEMENT (IOC) RIGHT OMIDRIA  CLAREON VIVITY TORIC 7.45 00:45.9 (Right)  Patient Location: PACU  Anesthesia Type: MAC  Level of Consciousness: awake, alert  and patient cooperative  Airway and Oxygen Therapy: Patient Spontanous Breathing and Patient connected to supplemental oxygen  Post-op Assessment: Post-op Vital signs reviewed, Patient's Cardiovascular Status Stable, Respiratory Function Stable, Patent Airway and No signs of Nausea or vomiting  Post-op Vital Signs: Reviewed and stable  Complications: No notable events documented.

## 2023-09-23 ENCOUNTER — Other Ambulatory Visit: Payer: Self-pay

## 2023-09-23 ENCOUNTER — Encounter: Payer: Self-pay | Admitting: Ophthalmology

## 2023-09-23 NOTE — Anesthesia Preprocedure Evaluation (Addendum)
Anesthesia Evaluation  Patient identified by MRN, date of birth, ID band Patient awake    Reviewed: Allergy & Precautions, H&P , NPO status , Patient's Chart, lab work & pertinent test results  Airway Mallampati: II  TM Distance: >3 FB Neck ROM: Full    Dental no notable dental hx.  Right upper lateral incisor is composite, has broken off several times and been repaired, looks intact now. None loose::   Pulmonary neg pulmonary ROS   Pulmonary exam normal breath sounds clear to auscultation       Cardiovascular hypertension, negative cardio ROS Normal cardiovascular exam Rhythm:Regular Rate:Normal     Neuro/Psych negative neurological ROS  negative psych ROS   GI/Hepatic negative GI ROS, Neg liver ROS,,,  Endo/Other  negative endocrine ROS    Renal/GU negative Renal ROS  negative genitourinary   Musculoskeletal negative musculoskeletal ROS (+)    Abdominal   Peds negative pediatric ROS (+)  Hematology negative hematology ROS (+)   Anesthesia Other Findings Previous cataract surgery 09-22-23 Dr. Juel Burrow and Cecilie Lowers CRNA, previously had versed 2 mg IV and fentanyl 100 mcg IV  GAD (generalized anxiety disorder)  GERD (gastroesophageal reflux disease) Chronic gout Hypertension Hx basal cell carcinoma (BCC) excision Malignant neoplasm prostate  Hyperlipidemia ED (erectile dysfunction)  History of multiple concussions DDD (degenerative disc disease), lumbosacral  Nocturia Wears contact lenses  Sciatica of right side    Reproductive/Obstetrics negative OB ROS                             Anesthesia Physical Anesthesia Plan  ASA: 2  Anesthesia Plan: MAC   Post-op Pain Management:    Induction: Intravenous  PONV Risk Score and Plan:   Airway Management Planned: Natural Airway and Nasal Cannula  Additional Equipment:   Intra-op Plan:   Post-operative Plan:   Informed  Consent: I have reviewed the patients History and Physical, chart, labs and discussed the procedure including the risks, benefits and alternatives for the proposed anesthesia with the patient or authorized representative who has indicated his/her understanding and acceptance.     Dental Advisory Given  Plan Discussed with: Anesthesiologist, CRNA and Surgeon  Anesthesia Plan Comments: (Patient consented for risks of anesthesia including but not limited to:  - adverse reactions to medications - damage to eyes, teeth, lips or other oral mucosa - nerve damage due to positioning  - sore throat or hoarseness - Damage to heart, brain, nerves, lungs, other parts of body or loss of life  Patient voiced understanding and assent.)        Anesthesia Quick Evaluation

## 2023-09-23 NOTE — Discharge Instructions (Signed)

## 2023-10-05 ENCOUNTER — Other Ambulatory Visit: Payer: Self-pay

## 2023-10-05 ENCOUNTER — Ambulatory Visit: Payer: BC Managed Care – PPO | Admitting: Anesthesiology

## 2023-10-05 ENCOUNTER — Encounter
Admission: RE | Disposition: A | Discharge status: Home / Self Care | Payer: Self-pay | Source: Home / Self Care | Attending: Ophthalmology

## 2023-10-05 ENCOUNTER — Encounter: Payer: Self-pay | Admitting: Ophthalmology

## 2023-10-05 ENCOUNTER — Ambulatory Visit
Admission: RE | Admit: 2023-10-05 | Discharge: 2023-10-05 | Disposition: A | Discharge status: Home / Self Care | Payer: BC Managed Care – PPO | Attending: Ophthalmology | Admitting: Ophthalmology

## 2023-10-05 DIAGNOSIS — I10 Essential (primary) hypertension: Secondary | ICD-10-CM | POA: Diagnosis not present

## 2023-10-05 DIAGNOSIS — K219 Gastro-esophageal reflux disease without esophagitis: Secondary | ICD-10-CM | POA: Diagnosis not present

## 2023-10-05 DIAGNOSIS — H2512 Age-related nuclear cataract, left eye: Secondary | ICD-10-CM | POA: Insufficient documentation

## 2023-10-05 HISTORY — PX: CATARACT EXTRACTION W/PHACO: SHX586

## 2023-10-05 SURGERY — PHACOEMULSIFICATION, CATARACT, WITH IOL INSERTION
Anesthesia: Monitor Anesthesia Care | Laterality: Left

## 2023-10-05 MED ORDER — SIGHTPATH DOSE#1 NA HYALUR & NA CHOND-NA HYALUR IO KIT
PACK | INTRAOCULAR | Status: DC | PRN
  Administered 2023-10-05: 1 via OPHTHALMIC

## 2023-10-05 MED ORDER — FENTANYL CITRATE (PF) 100 MCG/2ML IJ SOLN
INTRAMUSCULAR | Status: AC
  Filled 2023-10-05: qty 2

## 2023-10-05 MED ORDER — BRIMONIDINE TARTRATE-TIMOLOL 0.2-0.5 % OP SOLN
OPHTHALMIC | Status: DC | PRN
  Administered 2023-10-05: 1 [drp] via OPHTHALMIC

## 2023-10-05 MED ORDER — SIGHTPATH DOSE#1 BSS IO SOLN
INTRAOCULAR | Status: DC | PRN
  Administered 2023-10-05: 15 mL via INTRAOCULAR

## 2023-10-05 MED ORDER — ARMC OPHTHALMIC DILATING DROPS
1.0000 | OPHTHALMIC | Status: DC | PRN
  Administered 2023-10-05 (×3): 1 via OPHTHALMIC

## 2023-10-05 MED ORDER — MIDAZOLAM HCL 2 MG/2ML IJ SOLN
INTRAMUSCULAR | Status: AC
  Filled 2023-10-05: qty 2

## 2023-10-05 MED ORDER — SIGHTPATH DOSE#1 BSS IO SOLN
INTRAOCULAR | Status: DC | PRN
  Administered 2023-10-05: 80 mL via OPHTHALMIC

## 2023-10-05 MED ORDER — MOXIFLOXACIN HCL 0.5 % OP SOLN
OPHTHALMIC | Status: DC | PRN
  Administered 2023-10-05: .2 mL via OPHTHALMIC

## 2023-10-05 MED ORDER — LIDOCAINE HCL (PF) 2 % IJ SOLN
INTRAOCULAR | Status: DC | PRN
  Administered 2023-10-05: 4 mL via INTRAOCULAR

## 2023-10-05 MED ORDER — FENTANYL CITRATE (PF) 100 MCG/2ML IJ SOLN
INTRAMUSCULAR | Status: DC | PRN
  Administered 2023-10-05: 100 ug via INTRAVENOUS

## 2023-10-05 MED ORDER — MIDAZOLAM HCL 2 MG/2ML IJ SOLN
INTRAMUSCULAR | Status: DC | PRN
  Administered 2023-10-05: 2 mg via INTRAVENOUS

## 2023-10-05 MED ORDER — TETRACAINE HCL 0.5 % OP SOLN
1.0000 [drp] | OPHTHALMIC | Status: DC | PRN
  Administered 2023-10-05 (×3): 1 [drp] via OPHTHALMIC

## 2023-10-05 SURGICAL SUPPLY — 10 items
CATARACT SUITE SIGHTPATH (MISCELLANEOUS) ×1 IMPLANT
DISSECTOR HYDRO NUCLEUS 50X22 (MISCELLANEOUS) ×1 IMPLANT
DRSG TEGADERM 2-3/8X2-3/4 SM (GAUZE/BANDAGES/DRESSINGS) ×1 IMPLANT
FEE CATARACT SUITE SIGHTPATH (MISCELLANEOUS) ×1 IMPLANT
GLOVE SURG SYN 7.5 E (GLOVE) ×1 IMPLANT
GLOVE SURG SYN 7.5 PF PI (GLOVE) ×1 IMPLANT
GLOVE SURG SYN 8.5 E (GLOVE) ×1 IMPLANT
GLOVE SURG SYN 8.5 PF PI (GLOVE) ×1 IMPLANT
LENS CLAREON  VIVITY 12.0 ×1 IMPLANT
LENS IOL CLRN VT TRC 4 12.0 IMPLANT

## 2023-10-05 NOTE — Transfer of Care (Signed)
Immediate Anesthesia Transfer of Care Note  Patient: Marcus Kramer  Procedure(s) Performed: CATARACT EXTRACTION PHACO AND INTRAOCULAR LENS PLACEMENT (IOC) LEFT OMIDRIA  CLAREON VIVITY TORIC 4.27 00:32.2 (Left)  Patient Location: PACU  Anesthesia Type: MAC  Level of Consciousness: awake, alert  and patient cooperative  Airway and Oxygen Therapy: Patient Spontanous Breathing and Patient connected to supplemental oxygen  Post-op Assessment: Post-op Vital signs reviewed, Patient's Cardiovascular Status Stable, Respiratory Function Stable, Patent Airway and No signs of Nausea or vomiting  Post-op Vital Signs: Reviewed and stable  Complications: No notable events documented.

## 2023-10-05 NOTE — H&P (Signed)
[facility]   Primary Care Physician:  Danella Penton, MD Ophthalmologist: Dr. Deberah Pelton  Pre-Procedure History & Physical: HPI:  Marcus Kramer is a 62 y.o. male here for cataract surgery.   Past Medical History:  Diagnosis Date   Chronic gout    followed by pcp;  per pt right great toe   DDD (degenerative disc disease), lumbosacral    ED (erectile dysfunction)    Family history of colon cancer    Family history of glioblastoma    Family history of kidney cancer    GAD (generalized anxiety disorder)    GERD (gastroesophageal reflux disease)    History of basal cell carcinoma (BCC) excision 10/2021   History of multiple concussions 09/2022   followed by pcp  (per pt in high school and age 23 concussion w/ brief LOC both times;   12/ 2023 took new bp med / muscle relaxant with brief loc , headache next day,  stated no issue since)    (CT imaging in epic 09-16-2022 showing old infarts )   Hyperlipidemia    Hypertension    Malignant neoplasm prostate (HCC) 11/06/2022   urologist-- dr borden/  radiation onologist--- dr Kathrynn Running;  dx 02/ 2024, Gleason 4+3   Nocturia    Sciatica of right side    Wears contact lenses     Past Surgical History:  Procedure Laterality Date   BRONCHOSCOPY  2016   @ [facility]  (per pt benign)   CATARACT EXTRACTION W/PHACO Right 09/22/2023   Procedure: CATARACT EXTRACTION PHACO AND INTRAOCULAR LENS PLACEMENT (IOC) RIGHT OMIDRIA  CLAREON VIVITY TORIC 7.45 00:45.9;  Surgeon: Estanislado Pandy, MD;  Location: [facility];  Service: Ophthalmology;  Laterality: Right;   COLONOSCOPY  2019   INGUINAL HERNIA REPAIR Left 2001   and per pt vasectomy done also   MOHS SURGERY  10/2021   nose (bcc)   RADIOACTIVE SEED IMPLANT N/A 04/02/2023   Procedure: RADIOACTIVE SEED IMPLANT/BRACHYTHERAPY IMPLANT;  Surgeon: Heloise Purpura, MD;  Location: [facility];  Service: Urology;  Laterality: N/A;   RETINAL DETACHMENT SURGERY Right 2014    SPACE OAR INSTILLATION N/A 04/02/2023   Procedure: SPACE OAR INSTILLATION;  Surgeon: Heloise Purpura, MD;  Location: [facility];  Service: Urology;  Laterality: N/A;    Prior to Admission medications   Medication Sig Start Date End Date Taking? Authorizing Provider  allopurinol (ZYLOPRIM) 300 MG tablet Take 300 mg by mouth daily. 10/28/16   [provider]  Brimonidine Tartrate (LUMIFY) 0.025 % SOLN Apply to eye as needed.    [provider]  Carboxymethylcellulose Sodium (REFRESH TEARS OP) Apply to eye.    [provider]  diclofenac (VOLTAREN) 75 MG EC tablet Take 75 mg by mouth daily. 08/17/23   [provider]  diphenhydramine-acetaminophen (TYLENOL PM) 25-500 MG TABS tablet Take 1 tablet by mouth at bedtime as needed. Patient not taking: Reported on 09/14/2023    [provider]  losartan (COZAAR) 100 MG tablet Take 100 mg by mouth daily. 11/12/22   [provider]  omeprazole (PRILOSEC) 40 MG capsule Take 40 mg by mouth daily. 10/28/16   [provider]  sildenafil (REVATIO) 20 MG tablet Take 20 mg by mouth as needed (ED). 07/29/16   [provider]  tamsulosin (FLOMAX) 0.4 MG CAPS capsule Take 1 capsule (0.4 mg total) by mouth at bedtime. 04/02/23   Heloise Purpura, MD  venlafaxine XR (EFFEXOR-XR) 75 MG 24 hr  capsule Take 75 mg by mouth daily. 11/18/22   [provider]    Allergies as of 08/18/2023 - Review Complete 07/16/2023  Allergen Reaction Noted   Sulfa antibiotics Other (See Comments)     Family History  Problem Relation Age of Onset   Brain cancer Mother 61   Colon cancer Mother 43       2nd at 40   Other Mother 68       'male cancer"   Kidney cancer Mother 17   Stroke Father    Parkinson's disease Father    Prostate cancer Father        prostate cancer x2   Melanoma Brother 60   Brain cancer Maternal Grandmother 49       glioblastoma   Heart attack Maternal Grandfather     Lung cancer Maternal Grandfather    Parkinson's disease Paternal Grandfather    Stroke Paternal Grandfather     Social History   Socioeconomic History   Marital status: Married    Spouse name: Not on file   Number of children: Not on file   Years of education: Not on file   Highest education level: Not on file  Occupational History   Not on file  Tobacco Use   Smoking status: Never   Smokeless tobacco: Never  Vaping Use   Vaping status: Never Used  Substance and Sexual Activity   Alcohol use: Yes    Alcohol/week: 14.0 standard drinks of alcohol    Types: 14 Standard drinks or equivalent per week   Drug use: Never   Sexual activity: Yes    Birth control/protection: Surgical  Other Topics Concern   Not on file  Social History Narrative   Not on file   Social Drivers of Health   Financial Resource Strain: Not on file  Food Insecurity: No Food Insecurity (12/17/2022)   Hunger Vital Sign    Worried About Running Out of Food in the Last Year: Never true    Ran Out of Food in the Last Year: Never true  Transportation Needs: No Transportation Needs (12/17/2022)   PRAPARE - Administrator, Civil Service (Medical): No    Lack of Transportation (Non-Medical): No  Physical Activity: Not on file  Stress: Not on file  Social Connections: Not on file  Intimate Partner Violence: Not At Risk (12/17/2022)   Humiliation, Afraid, Rape, and Kick questionnaire    Fear of Current or Ex-Partner: No    Emotionally Abused: No    Physically Abused: No    Sexually Abused: No    Review of Systems: See HPI, otherwise negative ROS  Physical Exam: There were no vitals taken for this visit. General:   Alert, cooperative in NAD Head:  Normocephalic and atraumatic. Respiratory:  Normal work of breathing. Cardiovascular:  RRR  Impression/Plan: Marcus Kramer is here for cataract surgery.  Risks, benefits, limitations, and alternatives regarding cataract surgery have been  reviewed with the patient.  Questions have been answered.  All parties agreeable.   Estanislado Pandy, MD  10/05/2023, 10:06 AM

## 2023-10-05 NOTE — Op Note (Signed)
OPERATIVE NOTE  PRATHIK MAGOUIRK [id number] 10/05/2023   PREOPERATIVE DIAGNOSIS: Nuclear sclerotic cataract left eye. H25.12   POSTOPERATIVE DIAGNOSIS: Nuclear sclerotic cataract left eye. H25.12   PROCEDURE:  Phacoemusification with Toric posterior chamber intraocular lens placement of the left eye  Ultrasound time: Procedure(s): CATARACT EXTRACTION PHACO AND INTRAOCULAR LENS PLACEMENT (IOC) LEFT OMIDRIA  CLAREON VIVITY TORIC 4.27 00:32.2 (Left)  LENS:   Implant Name Type Inv. Item Serial No. Manufacturer Lot No. LRB No. Used Action  LENS CLAREON  VIVITY 12.0 - [id number]  LENS CLAREON  VIVITY 12.0 [id number] SIGHTPATH  Left 1 Implanted    CNWET4 Toric intraocular lens with 2.25 diopters of cylindrical power with axis orientation at 111 degrees.  SURGEON:  Julious Payer. Rolley Sims, MD   ANESTHESIA:  Topical with tetracaine drops, augmented with 1% preservative-free intracameral lidocaine.   COMPLICATIONS:  None.   DESCRIPTION OF PROCEDURE:  The patient was identified in the holding room and transported to the operating room and placed in the supine position under the operating microscope.  The left eye was identified as the operative eye, which was prepped and draped in the usual sterile ophthalmic fashion.   A 1 millimeter clear-corneal paracentesis was made inferotemporally. Preservative-free 1% lidocaine mixed with 1:1,000 bisulfite-free aqueous solution of epinephrine was injected into the anterior chamber. The anterior chamber was then filled with Viscoat viscoelastic. A 2.4 millimeter keratome was used to make a clear-corneal incision superotemporally. A curvilinear capsulorrhexis was made with a cystotome and capsulorrhexis forceps. Balanced salt solution was used to hydrodissect and hydrodelineate the nucleus. Phacoemulsification was then used to remove the lens nucleus and epinucleus. The remaining cortex was then removed using the irrigation and aspiration handpiece. Provisc was then  placed into the capsular bag to distend it for lens placement. The Verion digital marker was used to align the implant at the intended axis.  A +12.00 D CNWET4 Toric lens was then injected into the capsular bag.  It was rotated clockwise until the axis marks on the lens were approximately 15 degrees in the counterclockwise direction to the intended alignment.  The viscoelastic was aspirated from the eye using the irrigation aspiration handpiece.  Then, a Koch spatula through the sideport incision was used to rotate the lens in a clockwise direction until the axis markings of the intraocular lens were lined up with the Verion alignment.  Balanced salt solution was then used to hydrate the wounds.   The anterior chamber was inflated to a physiologic pressure with balanced salt solution.  No wound leaks were noted. Vigamox was injected intracamerally.  Timolol and Brimonidine drops were applied to the eye.  The patient was taken to the recovery room in stable condition without complications of anesthesia or surgery.  [employer] 10/05/2023, 1:01 PM

## 2023-10-06 ENCOUNTER — Encounter: Payer: Self-pay | Admitting: Ophthalmology

## 2023-10-12 NOTE — Anesthesia Postprocedure Evaluation (Signed)
 Anesthesia Post Note  Patient: Marcus Kramer  Procedure(s) Performed: CATARACT EXTRACTION PHACO AND INTRAOCULAR LENS PLACEMENT (IOC) LEFT OMIDRIA  CLAREON VIVITY TORIC 4.27 00:32.2 (Left)  Patient location during evaluation: PACU Anesthesia Type: MAC Level of consciousness: awake and alert Pain management: pain level controlled Vital Signs Assessment: post-procedure vital signs reviewed and stable Respiratory status: spontaneous breathing, nonlabored ventilation, respiratory function stable and patient connected to nasal cannula oxygen Cardiovascular status: stable and blood pressure returned to baseline Postop Assessment: no apparent nausea or vomiting Anesthetic complications: no   No notable events documented.   Last Vitals:  Vitals:   10/05/23 1302 10/05/23 1306  BP: (!) 130/99 (!) 138/90  Pulse: 85   Resp: 13   Temp: 36.6 C 36.6 C  SpO2: 97%     Last Pain:  Vitals:   10/05/23 1302  PainSc: 0-No pain                 Kathi Dohn C Alois Mincer
